# Patient Record
Sex: Female | Born: 1970 | Race: White | Hispanic: No | Marital: Married | Smoking: Never smoker
Health system: Southern US, Community
[De-identification: ages and names within clinical notes are randomized; demographics above are authoritative.]

## PROBLEM LIST (undated history)

## (undated) DIAGNOSIS — F32A Depression, unspecified: Secondary | ICD-10-CM

## (undated) DIAGNOSIS — F329 Major depressive disorder, single episode, unspecified: Secondary | ICD-10-CM

## (undated) DIAGNOSIS — F419 Anxiety disorder, unspecified: Secondary | ICD-10-CM

## (undated) HISTORY — DX: Depression, unspecified: F32.A

## (undated) HISTORY — DX: Anxiety disorder, unspecified: F41.9

## (undated) HISTORY — DX: Major depressive disorder, single episode, unspecified: F32.9

---

## 2002-11-29 ENCOUNTER — Other Ambulatory Visit: Admission: RE | Admit: 2002-11-29 | Discharge: 2002-11-29 | Payer: Self-pay | Admitting: Obstetrics and Gynecology

## 2003-11-12 ENCOUNTER — Inpatient Hospital Stay (HOSPITAL_COMMUNITY): Admission: AD | Admit: 2003-11-12 | Discharge: 2003-11-14 | Payer: Self-pay | Admitting: Pediatrics

## 2003-12-28 ENCOUNTER — Other Ambulatory Visit: Admission: RE | Admit: 2003-12-28 | Discharge: 2003-12-28 | Payer: Self-pay | Admitting: Obstetrics and Gynecology

## 2004-03-10 HISTORY — PX: DILATION AND CURETTAGE OF UTERUS: SHX78

## 2004-11-13 ENCOUNTER — Ambulatory Visit (HOSPITAL_COMMUNITY): Admission: RE | Admit: 2004-11-13 | Discharge: 2004-11-13 | Payer: Self-pay | Admitting: Obstetrics & Gynecology

## 2004-11-13 ENCOUNTER — Encounter (INDEPENDENT_AMBULATORY_CARE_PROVIDER_SITE_OTHER): Payer: Self-pay | Admitting: *Deleted

## 2004-11-29 ENCOUNTER — Ambulatory Visit: Payer: Self-pay | Admitting: Sports Medicine

## 2004-12-20 ENCOUNTER — Ambulatory Visit: Payer: Self-pay | Admitting: Sports Medicine

## 2005-11-14 ENCOUNTER — Inpatient Hospital Stay (HOSPITAL_COMMUNITY): Admission: RE | Admit: 2005-11-14 | Discharge: 2005-11-16 | Payer: Self-pay | Admitting: Obstetrics and Gynecology

## 2005-11-17 ENCOUNTER — Encounter: Admission: RE | Admit: 2005-11-17 | Discharge: 2005-12-16 | Payer: Self-pay | Admitting: Obstetrics and Gynecology

## 2005-12-17 ENCOUNTER — Encounter: Admission: RE | Admit: 2005-12-17 | Discharge: 2006-01-06 | Payer: Self-pay | Admitting: Obstetrics and Gynecology

## 2006-10-05 ENCOUNTER — Ambulatory Visit: Payer: Self-pay | Admitting: Otolaryngology

## 2006-12-25 ENCOUNTER — Encounter: Admission: RE | Admit: 2006-12-25 | Discharge: 2006-12-25 | Payer: Self-pay | Admitting: Obstetrics and Gynecology

## 2007-01-29 ENCOUNTER — Ambulatory Visit: Payer: Self-pay

## 2007-01-29 DIAGNOSIS — M79609 Pain in unspecified limb: Secondary | ICD-10-CM

## 2007-01-29 DIAGNOSIS — M25569 Pain in unspecified knee: Secondary | ICD-10-CM

## 2007-01-29 DIAGNOSIS — M7750 Other enthesopathy of unspecified foot: Secondary | ICD-10-CM | POA: Insufficient documentation

## 2008-05-17 ENCOUNTER — Inpatient Hospital Stay (HOSPITAL_COMMUNITY): Admission: AD | Admit: 2008-05-17 | Discharge: 2008-05-17 | Payer: Self-pay | Admitting: Obstetrics and Gynecology

## 2008-06-19 ENCOUNTER — Inpatient Hospital Stay (HOSPITAL_COMMUNITY): Admission: RE | Admit: 2008-06-19 | Discharge: 2008-06-21 | Payer: Self-pay | Admitting: Obstetrics and Gynecology

## 2008-06-22 ENCOUNTER — Encounter: Admission: RE | Admit: 2008-06-22 | Discharge: 2008-07-21 | Payer: Self-pay | Admitting: Obstetrics and Gynecology

## 2008-07-22 ENCOUNTER — Encounter: Admission: RE | Admit: 2008-07-22 | Discharge: 2008-08-01 | Payer: Self-pay | Admitting: Obstetrics and Gynecology

## 2009-03-10 HISTORY — PX: NASAL SINUS SURGERY: SHX719

## 2009-07-26 ENCOUNTER — Ambulatory Visit: Payer: Self-pay | Admitting: Otolaryngology

## 2009-12-13 ENCOUNTER — Ambulatory Visit: Payer: Self-pay | Admitting: Sports Medicine

## 2010-04-09 NOTE — Assessment & Plan Note (Signed)
Summary: ORTHOTICS,CHECK TOES,MC   Vital Signs:  Patient profile:   40 year old female Height:      70 inches Weight:      140 pounds BMI:     20.16 BP sitting:   124 / 74  History of Present Illness: 40yo female to office for new orthotics.  Last seen about 3-years ago when custom orthotics made for metatarsalgia & atypical plantar fasciitis.  Orthotics were comfortable, but now having increasing foot & toe pain - right > left.  Denies any injury or trauma.  Doing cardio workouts 3-times/wk (elliptical, biking, or running - avg 10 miles per week).  Pain most prominent in her R foot in area of 2nd-4th MTP.  Some assocaited numbness/tingling into her 3rd/4th toes.  Feels MT pads on orthotics no longer supplying support she needs.  Similar pain on left, but not as significant.  Also c/o achiness in her lower legs with activity.  Still wearing old orthotics with MT pads in her shoes.  Allergies (verified): 1)  ! Macrobid 2)  ! Septra 3)  ! Pcn PMH-FH-SH reviewed for relevance  Review of Systems      See HPI  Physical Exam  General:  Well-developed,well-nourished,in no acute distress; alert,appropriate and cooperative throughout examination Msk:  HIPS: full ROM without pain.  Mild weakness of hip adductors & abductors b/l, normal strenght in hip flexors & extensors b/l.  KNEES: normal ROM without pain, tenderness, weakness, laxity bilaterally.  FEET: cavus feet b/l Collapse of transverse arch b/l with splaying between 2nd/3rd toes b/l - left > right. Prominent callous under 2nd & 3rd MT heads Mild TTP along MT heads b/l - L>R No TTP along PF or over heels.  No achilles tenderness. Pulses:  +2/4 DP & PT b/l Neurologic:  sensation intact to light touch.     Impression & Recommendations:  Problem # 1:  FOOT PAIN, BILATERAL (ICD-729.5) -  Related to foot breakdown & metatarsalgia.  Cavus foot also contributes to increased stress throughout the foot.   - Fitted with new custom  orthotics: Patient was fitted for a : standard, cushioned, semi-rigid orthotic. The orthotic was heated and afterward the patient stood on the orthotic blank positioned on the orthotic stand. The patient was positioned in subtalar neutral position and 10 degrees of ankle dorsiflexion in a weight bearing stance. After completion of molding, a stable base was applied to the orthotic blank. The blank was ground to a stable position for weight bearing. Size: 7 - blue swirl Base: blue EVA foam Posting: none Additional orthotic padding: MT pad on both orthotics Orthotics comfortable in office & improved foot pain. - Repaired old set of orthotics with blue EVA foam cushioning.   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  MT pads placed in same position as old pads & felt comfortable in office today.  Orders: [employer], each unit [phone])  Problem # 3:  LEG PAIN (ICD-729.5) - Result of foot breakdown, but also noted to have weakness of hip adductors & abductors on exam today - Educated on HEP with hip adduction/abduction, flexion, extension exercises along with lateral step-ups.  Should perform exercises once daily. - f/u if no improvement with orthotics & exercises.

## 2010-04-29 LAB — HM COLONOSCOPY

## 2010-06-19 LAB — CBC
HCT: 29.7 % — ABNORMAL LOW (ref 36.0–46.0)
HCT: 34.9 % — ABNORMAL LOW (ref 36.0–46.0)
MCHC: 33.9 g/dL (ref 30.0–36.0)
Platelets: 232 10*3/uL (ref 150–400)
RBC: 3.82 MIL/uL — ABNORMAL LOW (ref 3.87–5.11)
RDW: 15 % (ref 11.5–15.5)
RDW: 15.3 % (ref 11.5–15.5)
WBC: 10.8 10*3/uL — ABNORMAL HIGH (ref 4.0–10.5)
WBC: 12.4 10*3/uL — ABNORMAL HIGH (ref 4.0–10.5)

## 2010-06-20 LAB — WET PREP, GENITAL
Clue Cells Wet Prep HPF POC: NONE SEEN
Trich, Wet Prep: NONE SEEN

## 2010-07-23 NOTE — Discharge Summary (Signed)
NAMEQUETZAL, MEANY            ACCOUNT NO.:  [id number]   MEDICAL RECORD NO.:  [id number]          PATIENT TYPE:  INP   LOCATION:  9124                          FACILITY:  [facility]   PHYSICIAN:  Gerrit Friends. Aldona Bar, M.D.   DATE OF BIRTH:  [birth year]   DATE OF ADMISSION:  06/19/2008  DATE OF DISCHARGE:  06/21/2008                               DISCHARGE SUMMARY   DISCHARGE DIAGNOSES:  1. Term pregnancy delivered 6 pounds 15 ounces female infant, Apgars 8      and 9.  2. Blood type O+.   PROCEDURES:  1. Induction of labor.  2. Normal spontaneous delivery.   SUMMARY:  This 40 year old gravida 4, now para 3 was admitted at term  for induction.  Her pregnancy was benign.  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   She was admitted for induction, progressed rapidly, and subsequently had  a normal spontaneous delivery of a viable female infant with good Apgars  over an intact perineum.  Her postpartum course was benign.  Her  discharge hemoglobin was 10.1 with a white count of 12,400 and a  platelet count of 177,000.  On the morning of June 21, 2008, she was  ambulating well, tolerating a regular diet well, having normal bowel and  bladder function, was afebrile, was breast-feeding without difficulty,  and was desirous of discharge.  Accordingly, she was given all  appropriate structures and understood all instructions well.   DISCHARGE MEDICATIONS:  Vitamins - one a day, Feosol capsules - one  daily, and she was given prescriptions for Motrin 600 mg every 6 hours  to use as needed for cramping or back pain and Tylox 1-2 every 4-6 hours  as needed for more severe pain.  She will return to the office for  followup in approximately 4 weeks' time or as needed.   CONDITION ON DISCHARGE:  Improved.      Gerrit Friends. Aldona Bar, M.D.  Electronically Signed     RMW/MEDQ  D:  06/21/2008  T:  06/21/2008  Job:  [id number]

## 2010-07-26 NOTE — Op Note (Signed)
Brittany Savage, Brittany Savage NO.:  [id number]   MEDICAL RECORD NO.:  [id number]          PATIENT TYPE:  AMB   LOCATION:  SDC                           FACILITY:  [facility]   PHYSICIAN:  Ilda Mori, M.D.   DATE OF BIRTH:  19-May-1970   DATE OF PROCEDURE:  11/13/2004  DATE OF DISCHARGE:                                 OPERATIVE REPORT   PREOPERATIVE DIAGNOSIS:  Fetal anomaly, cystic hygroma, 11-week gestation.   POSTOPERATIVE DIAGNOSIS:  Fetal anomaly, cystic hygroma, 11-week gestation.   PROCEDURE:  Dilatation, evacuation.   SURGEON:  Dr. Ilda Mori.   ANESTHESIA:  Was paracervical block with IV sedation.   ESTIMATED BLOOD LOSS:  20 mL.   FINDINGS:  Products of conception consistent with an 11-12 week pregnancy.   SPECIMENS:  POC to pathology and a fetal part was isolated and sent in a  special medium for chromosomal analysis.   COMPLICATIONS:  Were none.   INDICATIONS:  This is a 40 year old gravida 2, para 1 who was evaluated for  increased risk for chromosomal abnormalities with first trimester screening  technique. During the ultrasound screening for nuchal thickness, a cystic  hygroma was identified. The possible significance of this finding were  discussed with the patient and her husband and they elected to terminate the  pregnancy.   PROCEDURE:  The patient was taken to the operating room and placed in supine  position with IV sedation was administered and placed in dorsal supine  position. The vulva and vagina were prepped and draped in a sterile fashion.  20 mL of 1% plain lidocaine was placed in the paracervical tissues. The  internal os was dilated with Pratt dilators to a 37-French.  A #12 suction  curet was introduced into the endometrial cavity and the products of  conception were evacuated. The specimen was then evaluated and appeared to  be complete and sent for pathology and a portion for chromosomal analysis.  At this point the procedure  was terminated. The patient left the operating  room in good condition.      Ilda Mori, M.D.  Electronically Signed    RK/MEDQ  D:  11/13/2004  T:  11/13/2004  Job:  [id number]

## 2010-11-13 ENCOUNTER — Other Ambulatory Visit: Payer: Self-pay | Admitting: Obstetrics and Gynecology

## 2013-08-15 ENCOUNTER — Other Ambulatory Visit: Payer: Self-pay | Admitting: Obstetrics and Gynecology

## 2013-08-16 ENCOUNTER — Telehealth: Payer: Self-pay | Admitting: Genetic Counselor

## 2013-08-16 LAB — CYTOLOGY - PAP

## 2013-08-16 NOTE — Telephone Encounter (Signed)
CALLED PATIENT TO SCHEDULE GENETIC APPT PER PATIENT CALL BACK ON TOMORROW WAS IN MEETING.

## 2013-08-23 ENCOUNTER — Telehealth: Payer: Self-pay | Admitting: Genetic Counselor

## 2013-08-23 NOTE — Telephone Encounter (Signed)
LEFT MESSAGE FOR PATIENT TO RETURN CALL TO SCHEDULE GENETIC APPT.  °

## 2013-12-21 ENCOUNTER — Other Ambulatory Visit: Payer: Self-pay | Admitting: Obstetrics and Gynecology

## 2013-12-21 DIAGNOSIS — R928 Other abnormal and inconclusive findings on diagnostic imaging of breast: Secondary | ICD-10-CM

## 2014-01-02 ENCOUNTER — Other Ambulatory Visit: Payer: Self-pay

## 2014-01-03 ENCOUNTER — Ambulatory Visit
Admission: RE | Admit: 2014-01-03 | Discharge: 2014-01-03 | Disposition: A | Discharge status: Home / Self Care | Payer: PRIVATE HEALTH INSURANCE | Source: Ambulatory Visit | Attending: Obstetrics and Gynecology | Admitting: Obstetrics and Gynecology

## 2014-01-03 DIAGNOSIS — R928 Other abnormal and inconclusive findings on diagnostic imaging of breast: Secondary | ICD-10-CM

## 2014-05-03 ENCOUNTER — Ambulatory Visit: Payer: Self-pay | Admitting: Family Medicine

## 2014-05-11 DIAGNOSIS — R002 Palpitations: Secondary | ICD-10-CM | POA: Insufficient documentation

## 2014-05-11 DIAGNOSIS — R0789 Other chest pain: Secondary | ICD-10-CM | POA: Insufficient documentation

## 2014-08-18 ENCOUNTER — Other Ambulatory Visit: Payer: Self-pay | Admitting: Family Medicine

## 2014-08-18 DIAGNOSIS — F4322 Adjustment disorder with anxiety: Secondary | ICD-10-CM

## 2014-08-18 DIAGNOSIS — F4320 Adjustment disorder, unspecified: Secondary | ICD-10-CM | POA: Insufficient documentation

## 2014-09-12 ENCOUNTER — Other Ambulatory Visit: Payer: Self-pay | Admitting: Obstetrics and Gynecology

## 2014-09-13 LAB — CYTOLOGY - PAP

## 2014-10-07 ENCOUNTER — Other Ambulatory Visit: Payer: Self-pay | Admitting: Family Medicine

## 2014-10-07 DIAGNOSIS — J309 Allergic rhinitis, unspecified: Secondary | ICD-10-CM

## 2014-10-09 ENCOUNTER — Other Ambulatory Visit: Payer: Self-pay | Admitting: Family Medicine

## 2014-10-09 DIAGNOSIS — J309 Allergic rhinitis, unspecified: Secondary | ICD-10-CM | POA: Insufficient documentation

## 2014-10-09 NOTE — Telephone Encounter (Signed)
Last ov 04/2014  Thanks,   -Mickel Baas

## 2015-06-26 ENCOUNTER — Other Ambulatory Visit: Payer: Self-pay

## 2015-06-26 DIAGNOSIS — Z1231 Encounter for screening mammogram for malignant neoplasm of breast: Secondary | ICD-10-CM

## 2015-07-13 ENCOUNTER — Ambulatory Visit
Admission: RE | Admit: 2015-07-13 | Discharge: 2015-07-13 | Disposition: A | Discharge status: Home / Self Care | Payer: PRIVATE HEALTH INSURANCE | Source: Ambulatory Visit

## 2015-07-13 DIAGNOSIS — Z1231 Encounter for screening mammogram for malignant neoplasm of breast: Secondary | ICD-10-CM

## 2015-07-17 ENCOUNTER — Ambulatory Visit: Payer: PRIVATE HEALTH INSURANCE

## 2015-08-07 ENCOUNTER — Ambulatory Visit: Payer: PRIVATE HEALTH INSURANCE

## 2015-08-16 ENCOUNTER — Other Ambulatory Visit: Payer: Self-pay | Admitting: Family Medicine

## 2015-08-16 DIAGNOSIS — F4322 Adjustment disorder with anxiety: Secondary | ICD-10-CM

## 2015-10-08 ENCOUNTER — Other Ambulatory Visit: Payer: Self-pay | Admitting: Family Medicine

## 2015-10-08 DIAGNOSIS — J309 Allergic rhinitis, unspecified: Secondary | ICD-10-CM

## 2015-11-08 ENCOUNTER — Other Ambulatory Visit: Payer: Self-pay | Admitting: Family Medicine

## 2015-11-08 DIAGNOSIS — F4322 Adjustment disorder with anxiety: Secondary | ICD-10-CM

## 2015-12-10 ENCOUNTER — Other Ambulatory Visit: Payer: Self-pay | Admitting: Obstetrics and Gynecology

## 2015-12-11 LAB — CYTOLOGY - PAP

## 2016-04-02 ENCOUNTER — Ambulatory Visit (INDEPENDENT_AMBULATORY_CARE_PROVIDER_SITE_OTHER): Payer: PRIVATE HEALTH INSURANCE | Admitting: Physician Assistant

## 2016-04-02 ENCOUNTER — Encounter: Payer: Self-pay | Admitting: Physician Assistant

## 2016-04-02 VITALS — BP 118/62 | HR 60 | Temp 98.6°F | Resp 16 | Wt 164.0 lb

## 2016-04-02 DIAGNOSIS — R6889 Other general symptoms and signs: Secondary | ICD-10-CM

## 2016-04-02 LAB — POCT INFLUENZA A/B
Influenza A, POC: NEGATIVE
Influenza B, POC: NEGATIVE

## 2016-04-02 MED ORDER — OSELTAMIVIR PHOSPHATE 75 MG PO CAPS
75.0000 mg | ORAL_CAPSULE | Freq: Two times a day (BID) | ORAL | 0 refills | Status: AC
Start: 2016-04-02 — End: 2016-04-07

## 2016-04-02 NOTE — Patient Instructions (Signed)

## 2016-04-02 NOTE — Progress Notes (Signed)
[place]  Chief Complaint  Patient presents with  . URI    Started Monday    Subjective:    Patient ID: Brittany Savage, female    DOB: 19-Jun-1970, 46 y.o.   MRN: [id number]  Upper Respiratory Infection: Brittany Savage is a 47 y.o. female with exposure to her flu positive children complaining of symptoms of a URI. Symptoms include congestion, cough and sore throat. Onset of symptoms was 2 days ago, gradually worsening since that time. She also c/o achiness, congestion, cough described as productive and nasal congestion for the past 2 days .  She is drinking plenty of fluids. Evaluation to date: none. Treatment to date: cough suppressants. The treatment has provided no relief.   Review of Systems  Constitutional: Positive for diaphoresis and fatigue. Negative for activity change, appetite change, chills, fever and unexpected weight change.  HENT: Positive for congestion and sore throat. Negative for ear discharge, ear pain, nosebleeds, postnasal drip, rhinorrhea, sinus pain, sinus pressure and sneezing.   Eyes: Positive for itching. Negative for photophobia, pain, discharge, redness and visual disturbance.  Respiratory: Positive for cough, chest tightness and shortness of breath. Negative for apnea, choking, wheezing and stridor.   Gastrointestinal: Positive for nausea. Negative for abdominal distention, abdominal pain, anal bleeding, blood in stool, constipation, diarrhea, rectal pain and vomiting.  Musculoskeletal: Positive for arthralgias and myalgias.  Neurological: Positive for headaches. Negative for dizziness and light-headedness.       Objective:   BP 118/62 (BP Location: Left Arm, Patient Position: Sitting, Cuff Size: Normal)   Pulse 60   Temp 98.6 F (37 C) (Oral)   Resp 16   Wt 164 lb (74.4 kg)   LMP 03/17/2016   BMI 23.53 kg/m   Patient Active Problem List   Diagnosis Date Noted  . Allergic rhinitis 10/09/2014  .  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 KNEE PAIN, LEFT 01/29/2007  . METATARSALGIA 01/29/2007  . Pain in limb 01/29/2007    Outpatient Encounter Prescriptions as of 04/02/2016  Medication Sig  . fluticasone (FLONASE) 50 MCG/ACT nasal spray USE TWO PUFFS INTO EACH NOSTRIL DAILY  . sertraline (ZOLOFT) 100 MG tablet TAKE 1 TABLET EVERY DAY  . oseltamivir (TAMIFLU) 75 MG capsule Take 1 capsule (75 mg total) by mouth 2 (two) times daily.   No facility-administered encounter medications on file as of 04/02/2016.     Allergies  Allergen Reactions  . Nitrofurantoin   . Penicillins   . Sulfamethoxazole-Trimethoprim        Physical Exam  Constitutional: She appears well-developed and well-nourished. She appears ill.  HENT:  Right Ear: External ear normal.  Left Ear: External ear normal.  Mouth/Throat: Oropharynx is clear and moist. No oropharyngeal exudate.  Eyes: Right eye exhibits discharge. Left eye exhibits discharge.  Neck: Neck supple.  Cardiovascular: Normal rate and regular rhythm.   Pulmonary/Chest: Effort normal. No respiratory distress. She has wheezes. She has no rales.  Mild right sided expiratory wheeze.  Lymphadenopathy:    She has no cervical adenopathy.  Skin: Skin is warm and dry.  Psychiatric: She has a normal mood and affect. Her behavior is normal.       Assessment & Plan:  1. Flu-like symptoms  Will treat as below despite rapid flu swab being negative 2/2 exposure to flu positive kids and her having had flu vaccine. Patient is prone to sinus infections and she may call back in 7 days if this turns into one.  -  oseltamivir (TAMIFLU) 75 MG capsule; Take 1 capsule (75 mg total) by mouth 2 (two) times daily.  Dispense: 10 capsule; Refill: 0   Recommend rest, fluids, frequent hand washing. Work note provided  Return if symptoms worsen or fail to improve.   Patient Instructions  Influenza, Adult Influenza ("the flu") is an infection in the lungs, nose, and  throat (respiratory tract). It is caused by a virus. The flu causes many common cold symptoms, as well as a high fever and body aches. It can make you feel very sick. The flu spreads easily from person to person (is contagious). Getting a flu shot (influenza vaccination) every year is the best way to prevent the flu. Follow these instructions at home:  Take over-the-counter and prescription medicines only as told by your doctor.  Use a cool mist humidifier to add moisture (humidity) to the air in your home. This can make it easier to breathe.  Rest as needed.  Drink enough fluid to keep your pee (urine) clear or pale yellow.  Cover your mouth and nose when you cough or sneeze.  Wash your hands with soap and water often, especially after you cough or sneeze. If you cannot use soap and water, use hand sanitizer.  Stay home from work or school as told by your doctor. Unless you are visiting your doctor, try to avoid leaving home until your fever has been gone for 24 hours without the use of medicine.  Keep all follow-up visits as told by your doctor. This is important. How is this prevented?  Getting a yearly (annual) flu shot is the best way to avoid getting the flu. You may get the flu shot in late summer, fall, or winter. Ask your doctor when you should get your flu shot.  Wash your hands often or use hand sanitizer often.  Avoid contact with people who are sick during cold and flu season.  Eat healthy foods.  Drink plenty of fluids.  Get enough sleep.  Exercise regularly. Contact a doctor if:  You get new symptoms.  You have:  Chest pain.  Watery poop (diarrhea).  A fever.  Your cough gets worse.  You start to have more mucus.  You feel sick to your stomach (nauseous).  You throw up (vomit). Get help right away if:  You start to be short of breath or have trouble breathing.  Your skin or nails turn a bluish color.  You have very bad pain or stiffness in  your neck.  You get a sudden headache.  You get sudden pain in your face or ear.  You cannot stop throwing up. This information is not intended to replace advice given to you by your health care provider. Make sure you discuss any questions you have with your health care provider. Document Released: 12/04/2007 Document Revised: 08/02/2015 Document Reviewed: 12/19/2014 Elsevier Interactive Patient Education  2017 [employer].     The entirety of the information documented in the History of Present Illness, Review of Systems and Physical Exam were personally obtained by me. Portions of this information were initially documented by Ashley Royalty, CMA and reviewed by me for thoroughness and accuracy.

## 2016-04-03 ENCOUNTER — Telehealth: Payer: Self-pay

## 2016-04-03 NOTE — Telephone Encounter (Signed)
Patient requesting a prescription for albuterol inhaler. Patient reports she started wheezing this afternoon denies shortness of breath. Please review. Thank you. sd

## 2016-04-04 ENCOUNTER — Other Ambulatory Visit: Payer: Self-pay | Admitting: Physician Assistant

## 2016-04-04 DIAGNOSIS — R062 Wheezing: Secondary | ICD-10-CM

## 2016-04-04 MED ORDER — ALBUTEROL SULFATE HFA 108 (90 BASE) MCG/ACT IN AERS: 2.0000 | INHALATION_SPRAY | Freq: Four times a day (QID) | RESPIRATORY_TRACT | 2 refills | Status: DC | PRN

## 2016-04-04 NOTE — Progress Notes (Signed)
Sent in albuterol inhaler to [facility]. 2 puffs every six hours as needed.

## 2016-05-22 ENCOUNTER — Encounter: Payer: Self-pay | Admitting: Physician Assistant

## 2016-05-22 ENCOUNTER — Ambulatory Visit (INDEPENDENT_AMBULATORY_CARE_PROVIDER_SITE_OTHER): Payer: BLUE CROSS/BLUE SHIELD | Admitting: Physician Assistant

## 2016-05-22 VITALS — BP 122/84 | HR 72 | Temp 98.8°F | Resp 16 | Ht 70.0 in | Wt 161.0 lb

## 2016-05-22 DIAGNOSIS — R1031 Right lower quadrant pain: Secondary | ICD-10-CM | POA: Diagnosis not present

## 2016-05-22 DIAGNOSIS — Z0000 Encounter for general adult medical examination without abnormal findings: Secondary | ICD-10-CM

## 2016-05-22 DIAGNOSIS — R61 Generalized hyperhidrosis: Secondary | ICD-10-CM

## 2016-05-22 NOTE — Patient Instructions (Signed)
Health Maintenance, Female Adopting a healthy lifestyle and getting preventive care can go a long way to promote health and wellness. Talk with your health care provider about what schedule of regular examinations is right for you. This is a good chance for you to check in with your provider about disease prevention and staying healthy. In between checkups, there are plenty of things you can do on your own. Experts have done a lot of research about which lifestyle changes and preventive measures are most likely to keep you healthy. Ask your health care provider for more information. Weight and diet Eat a healthy diet  Be sure to include plenty of vegetables, fruits, low-fat dairy products, and lean protein.  Do not eat a lot of foods high in solid fats, added sugars, or salt.  Get regular exercise. This is one of the most important things you can do for your health.  Most adults should exercise for at least 150 minutes each week. The exercise should increase your heart rate and make you sweat (moderate-intensity exercise).  Most adults should also do strengthening exercises at least twice a week. This is in addition to the moderate-intensity exercise. Maintain a healthy weight  Body mass index (BMI) is a measurement that can be used to identify possible weight problems. It estimates body fat based on height and weight. Your health care provider can help determine your BMI and help you achieve or maintain a healthy weight.  For females 48 years of age and older:  A BMI below 18.5 is considered underweight.  A BMI of 18.5 to 24.9 is normal.  A BMI of 25 to 29.9 is considered overweight.  A BMI of 30 and above is considered obese. Watch levels of cholesterol and blood lipids  You should start having your blood tested for lipids and cholesterol at 48 years of age, then have this test every 5 years.  You may need to have your cholesterol levels checked more often if:  Your lipid or  cholesterol levels are high.  You are older than 48 years of age.  You are at high risk for heart disease. Cancer screening Lung Cancer  Lung cancer screening is recommended for adults 64-48 years old who are at high risk for lung cancer because of a history of smoking.  A yearly low-dose CT scan of the lungs is recommended for people who:  Currently smoke.  Have quit within the past 15 years.  Have at least a 30-pack-year history of smoking. A pack year is smoking an average of one pack of cigarettes a day for 1 year.  Yearly screening should continue until it has been 15 years since you quit.  Yearly screening should stop if you develop a health problem that would prevent you from having lung cancer treatment. Breast Cancer  Practice breast self-awareness. This means understanding how your breasts normally appear and feel.  It also means doing regular breast self-exams. Let your health care provider know about any changes, no matter how small.  If you are in your 48s or 30s, you should have a clinical breast exam (CBE) by a health care provider every 1-3 years as part of a regular health exam.  If you are 48 or older, have a CBE every year. Also consider having a breast X-ray (mammogram) every year.  If you have a family history of breast cancer, talk to your health care provider about genetic screening.  If you are at high risk for breast cancer, talk  to your health care provider about having an MRI and a mammogram every year.  Breast cancer gene (BRCA) assessment is recommended for women who have family members with BRCA-related cancers. BRCA-related cancers include:  Breast.  Ovarian.  Tubal.  Peritoneal cancers.  Results of the assessment will determine the need for genetic counseling and BRCA1 and BRCA2 testing. Cervical Cancer  Your health care provider may recommend that you be screened regularly for cancer of the pelvic organs (ovaries, uterus, and vagina).  This screening involves a pelvic examination, including checking for microscopic changes to the surface of your cervix (Pap test). You may be encouraged to have this screening done every 3 years, beginning at age 48.  For women ages 66-65, health care providers may recommend pelvic exams and Pap testing every 3 years, or they may recommend the Pap and pelvic exam, combined with testing for human papilloma virus (HPV), every 5 years. Some types of HPV increase your risk of cervical cancer. Testing for HPV may also be done on women of any age with unclear Pap test results.  Other health care providers may not recommend any screening for nonpregnant women who are considered low risk for pelvic cancer and who do not have symptoms. Ask your health care provider if a screening pelvic exam is right for you.  If you have had past treatment for cervical cancer or a condition that could lead to cancer, you need Pap tests and screening for cancer for at least 20 years after your treatment. If Pap tests have been discontinued, your risk factors (such as having a new sexual partner) need to be reassessed to determine if screening should resume. Some women have medical problems that increase the chance of getting cervical cancer. In these cases, your health care provider may recommend more frequent screening and Pap tests. Colorectal Cancer  This type of cancer can be detected and often prevented.  Routine colorectal cancer screening usually begins at 48 years of age and continues through 48 years of age.  Your health care provider may recommend screening at an earlier age if you have risk factors for colon cancer.  Your health care provider may also recommend using home test kits to check for hidden blood in the stool.  A small camera at the end of a tube can be used to examine your colon directly (sigmoidoscopy or colonoscopy). This is done to check for the earliest forms of colorectal cancer.  Routine  screening usually begins at age 48.  Direct examination of the colon should be repeated every 5-10 years through 48 years of age. However, you may need to be screened more often if early forms of precancerous polyps or small growths are found. Skin Cancer  Check your skin from head to toe regularly.  Tell your health care provider about any new moles or changes in moles, especially if there is a change in a mole's shape or color.  Also tell your health care provider if you have a mole that is larger than the size of a pencil eraser.  Always use sunscreen. Apply sunscreen liberally and repeatedly throughout the day.  Protect yourself by wearing long sleeves, pants, a wide-brimmed hat, and sunglasses whenever you are outside. Heart disease, diabetes, and high blood pressure  High blood pressure causes heart disease and increases the risk of stroke. High blood pressure is more likely to develop in:  People who have blood pressure in the high end of the normal range (130-139/85-89 mm Hg).  People who are overweight or obese.  People who are African American.  If you are 59-24 years of age, have your blood pressure checked every 3-5 years. If you are 34 years of age or older, have your blood pressure checked every year. You should have your blood pressure measured twice-once when you are at a hospital or clinic, and once when you are not at a hospital or clinic. Record the average of the two measurements. To check your blood pressure when you are not at a hospital or clinic, you can use:  An automated blood pressure machine at a pharmacy.  A home blood pressure monitor.  If you are between 29 years and 60 years old, ask your health care provider if you should take aspirin to prevent strokes.  Have regular diabetes screenings. This involves taking a blood sample to check your fasting blood sugar level.  If you are at a normal weight and have a low risk for diabetes, have this test once  every three years after 48 years of age.  If you are overweight and have a high risk for diabetes, consider being tested at a younger age or more often. Preventing infection Hepatitis B  If you have a higher risk for hepatitis B, you should be screened for this virus. You are considered at high risk for hepatitis B if:  You were born in a country where hepatitis B is common. Ask your health care provider which countries are considered high risk.  Your parents were born in a high-risk country, and you have not been immunized against hepatitis B (hepatitis B vaccine).  You have HIV or AIDS.  You use needles to inject street drugs.  You live with someone who has hepatitis B.  You have had sex with someone who has hepatitis B.  You get hemodialysis treatment.  You take certain medicines for conditions, including cancer, organ transplantation, and autoimmune conditions. Hepatitis C  Blood testing is recommended for:  Everyone born from [birth year] through 1967.  Anyone with known risk factors for hepatitis C. Sexually transmitted infections (STIs)  You should be screened for sexually transmitted infections (STIs) including gonorrhea and chlamydia if:  You are sexually active and are younger than 48 years of age.  You are older than 48 years of age and your health care provider tells you that you are at risk for this type of infection.  Your sexual activity has changed since you were last screened and you are at an increased risk for chlamydia or gonorrhea. Ask your health care provider if you are at risk.  If you do not have HIV, but are at risk, it may be recommended that you take a prescription medicine daily to prevent HIV infection. This is called pre-exposure prophylaxis (PrEP). You are considered at risk if:  You are sexually active and do not regularly use condoms or know the HIV status of your partner(s).  You take drugs by injection.  You are sexually active with a partner  who has HIV. Talk with your health care provider about whether you are at high risk of being infected with HIV. If you choose to begin PrEP, you should first be tested for HIV. You should then be tested every 3 months for as long as you are taking PrEP. Pregnancy  If you are premenopausal and you may become pregnant, ask your health care provider about preconception counseling.  If you may become pregnant, take 400 to 800 micrograms (mcg) of folic acid  every day.  If you want to prevent pregnancy, talk to your health care provider about birth control (contraception). Osteoporosis and menopause  Osteoporosis is a disease in which the bones lose minerals and strength with aging. This can result in serious bone fractures. Your risk for osteoporosis can be identified using a bone density scan.  If you are 4 years of age or older, or if you are at risk for osteoporosis and fractures, ask your health care provider if you should be screened.  Ask your health care provider whether you should take a calcium or vitamin D supplement to lower your risk for osteoporosis.  Menopause may have certain physical symptoms and risks.  Hormone replacement therapy may reduce some of these symptoms and risks. Talk to your health care provider about whether hormone replacement therapy is right for you. Follow these instructions at home:  Schedule regular health, dental, and eye exams.  Stay current with your immunizations.  Do not use any tobacco products including cigarettes, chewing tobacco, or electronic cigarettes.  If you are pregnant, do not drink alcohol.  If you are breastfeeding, limit how much and how often you drink alcohol.  Limit alcohol intake to no more than 1 drink per day for nonpregnant women. One drink equals 12 ounces of beer, 5 ounces of wine, or 1 ounces of hard liquor.  Do not use street drugs.  Do not share needles.  Ask your health care provider for help if you need support  or information about quitting drugs.  Tell your health care provider if you often feel depressed.  Tell your health care provider if you have ever been abused or do not feel safe at home. This information is not intended to replace advice given to you by your health care provider. Make sure you discuss any questions you have with your health care provider. Document Released: 09/09/2010 Document Revised: 08/02/2015 Document Reviewed: 11/28/2014 Elsevier Interactive Patient Education  2017 [employer].

## 2016-05-22 NOTE — Progress Notes (Signed)
Patient: Brittany Savage, Female    DOB: 1970-07-02, 46 y.o.   MRN: [id number] Visit Date: 05/22/2016  Today's Provider: Trinna Post, PA-C   Chief Complaint  Patient presents with  . Annual Exam   Subjective:    Annual physical exam Brittany Savage is a 46 y.o. female who presents today for health maintenance and complete physical.  She feels fairly well.  Pt reports both her ears feel full. Also says she has a "Dull Ache" Lower right abdomen.  She reports exercising regularly. She reports she is sleeping fairly well.  She is having trouble with night sweats, but says she will drink three glasses of wine at night and it helps with the symptoms.   She lives in [place] with her husband and three children who are ages 57, 49, and 42. 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 She lives in [place].  Some ear fullness with traveling, uses flonase and then Sudafed before flights.  She smoked briefly and infrequently in high school. She drinks three 5oz glasses of wine per night. Reports it helps with night sweats. She has gone a month without drinking at one point when trying to do an alcohol free period, but has since resumed. Not using drugs. Has looked up a drug which reduces cravings.  She sees Dr. Freda Munro at [facility] in [place] for gynecologic care. She has a history of HPV positive and abnormal cervical cells at age 21-20, had to get laser ablation. She gets PAPs every year. They have been normal. She also gets her mammograms through OBGYN, no history of biopsy or malignancy. Breast cancer in mother at age 20.  Has been having intermittent RLQ aching. Happens about 1-2 per week. 2/10 on pain scale, just dull aching. She thinks it's MSK as she does a lot of working out. No bowel/bladder issues, no abnormal vaginal bleeding.   She is currently having periods. Last one was heavier than normal.  Sexually active, no protection, husband had vasectomy.  Taking zoloft 100 mg for depression, doing well. This was last refilled by OBGYN. No SI/HI.  History of colon cancer in grandmother in her late 65's. -----------------------------------------------------------------   Review of Systems  Constitutional: Negative.   HENT: Negative.   Eyes: Negative.   Respiratory: Negative.   Cardiovascular: Negative.   Gastrointestinal: Negative.   Endocrine: Negative.   Genitourinary: Negative.   Musculoskeletal: Negative.   Skin: Negative.   Allergic/Immunologic: Negative.   Neurological: Negative.   Hematological: Negative.   Psychiatric/Behavioral: Negative.     Social History      She  reports that she has never smoked. She has never used smokeless tobacco. She reports that she drinks about 12.6 oz of alcohol per week . She reports that she does not use drugs.   Depression screen PHQ 2/9 05/22/2016  Decreased Interest 0  Down, Depressed, Hopeless 0  PHQ - 2 Score 0          Social History   Social History  . Marital status: Married    Spouse name: N/A  . Number of children: N/A  . Years of education: N/A   Social History Main Topics  . Smoking status: Never Smoker  . Smokeless tobacco: Never Used  . Alcohol use 12.6 oz/week    21 Glasses of wine per week  . Drug use: No  . Sexual activity: Not  Asked   Other Topics Concern  . None   Social History Narrative  . None    No past medical history on file.   Patient Active Problem List   Diagnosis Date Noted  . Allergic rhinitis 10/09/2014  . 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 KNEE PAIN, LEFT 01/29/2007  . METATARSALGIA 01/29/2007  . Pain in limb 01/29/2007    Past Surgical History:  Procedure Laterality Date  . DILATION AND CURETTAGE OF UTERUS  2006  . NASAL SINUS SURGERY  2011    Family History        Family Status  Relation Status  . Mother Alive  . Father Alive        Her family history includes  Breast cancer in her mother; Heart disease in her father.     Allergies  Allergen Reactions  . Nitrofurantoin   . Penicillins   . Sulfamethoxazole-Trimethoprim        Current Outpatient Prescriptions:  .  albuterol (PROVENTIL HFA;VENTOLIN HFA) 108 (90 Base) MCG/ACT inhaler, Inhale 2 puffs into the lungs every 6 (six) hours as needed for wheezing or shortness of breath., Disp: 1 Inhaler, Rfl: 2 .  fluticasone (FLONASE) 50 MCG/ACT nasal spray, USE TWO PUFFS INTO EACH NOSTRIL DAILY, Disp: 16 g, Rfl: 5 .  sertraline (ZOLOFT) 100 MG tablet, TAKE 1 TABLET EVERY DAY, Disp: 90 tablet, Rfl: 0   Patient Care Team: Trinna Post, PA-C as PCP - General (Physician Assistant)      Objective:   Vitals: BP 122/84 (BP Location: Left Arm, Patient Position: Sitting, Cuff Size: Normal)   Pulse 72   Temp 98.8 F (37.1 C) (Oral)   Resp 16   Ht 5\' 10"  (1.778 m)   Wt 161 lb (73 kg)   LMP 05/11/2016   BMI 23.10 kg/m    Vitals:   05/22/16 0923  BP: 122/84  Pulse: 72  Resp: 16  Temp: 98.8 F (37.1 C)  TempSrc: Oral  Weight: 161 lb (73 kg)  Height: 5\' 10"  (1.778 m)     Physical Exam  Constitutional: She is oriented to person, place, and time. She appears well-developed and well-nourished. No distress.  HENT:  Right Ear: Tympanic membrane and external ear normal.  Left Ear: Tympanic membrane and external ear normal.  Mouth/Throat: Oropharynx is clear and moist. No oropharyngeal exudate.  Eyes: Conjunctivae are normal. Pupils are equal, round, and reactive to light.  Neck: Neck supple. No thyromegaly present.  Cardiovascular: Normal rate, regular rhythm and normal heart sounds.   Pulmonary/Chest: Effort normal and breath sounds normal.  Abdominal: Soft. Bowel sounds are normal. She exhibits no distension and no mass. There is no tenderness. There is no rebound and no guarding.  Lymphadenopathy:    She has no cervical adenopathy.  Neurological: She is alert and oriented to person,  place, and time.  Skin: Skin is warm and dry.  Psychiatric: She has a normal mood and affect. Her behavior is normal.     Depression Screen PHQ 2/9 Scores 05/22/2016  PHQ - 2 Score 0      Assessment & Plan:     Routine Health Maintenance and Physical Exam  Exercise Activities and Dietary recommendations Goals    None       There is no immunization history on file for this patient.  Health Maintenance  Topic Date Due  . HIV Screening  01/23/1984  . TETANUS/TDAP  01/23/1988  . INFLUENZA VACCINE  10/09/2015  . PAP SMEAR  12/10/2018     Discussed health benefits of physical activity, and encouraged her to engage in regular exercise appropriate for her age and condition.    1. Annual physical exam  Breast and pelvic deferred to GYN. Will have patient sign ROI so we can have PAP results. Labs as below.  Discussed alcohol use with patient and recommended daily limit. Patient has also had colonoscopy in 2012 that was normal.   - Comprehensive metabolic panel - CBC with Differential/Platelet - Lipid panel  2. Night sweats  - TSH  3. RLQ abdominal pain  Seems MSK. No alarm features. Pt will call back if worsening.  The entirety of the information documented in the History of Present Illness, Review of Systems and Physical Exam were personally obtained by me. Portions of this information were initially documented by Ashley Royalty, CMA and reviewed by me for thoroughness and accuracy.   Return in about 1 year (around 05/22/2017) for CPE .   --------------------------------------------------------------------    Trinna Post, PA-C  [facility]

## 2016-05-23 LAB — COMPREHENSIVE METABOLIC PANEL
ALT: 16 IU/L (ref 0–32)
AST: 24 IU/L (ref 0–40)
Albumin/Globulin Ratio: 1.9 (ref 1.2–2.2)
Albumin: 4.6 g/dL (ref 3.5–5.5)
Alkaline Phosphatase: 52 IU/L (ref 39–117)
BUN/Creatinine Ratio: 16 (ref 9–23)
BUN: 12 mg/dL (ref 6–24)
Bilirubin Total: 0.4 mg/dL (ref 0.0–1.2)
CO2: 25 mmol/L (ref 18–29)
Calcium: 9.1 mg/dL (ref 8.7–10.2)
Chloride: 98 mmol/L (ref 96–106)
Creatinine, Ser: 0.75 mg/dL (ref 0.57–1.00)
GFR calc Af Amer: 110 mL/min/{1.73_m2} (ref >59)
GFR calc non Af Amer: 95 mL/min/{1.73_m2} (ref >59)
Globulin, Total: 2.4 g/dL (ref 1.5–4.5)
Glucose: 84 mg/dL (ref 65–99)
Potassium: 5 mmol/L (ref 3.5–5.2)
Sodium: 137 mmol/L (ref 134–144)
Total Protein: 7 g/dL (ref 6.0–8.5)

## 2016-05-23 LAB — CBC WITH DIFFERENTIAL/PLATELET
Basophils Absolute: 0.1 10*3/uL (ref 0.0–0.2)
Basos: 1 %
EOS (ABSOLUTE): 0.2 10*3/uL (ref 0.0–0.4)
Eos: 2 %
Hematocrit: 38.7 % (ref 34.0–46.6)
Hemoglobin: 12.6 g/dL (ref 11.1–15.9)
Immature Grans (Abs): 0 10*3/uL (ref 0.0–0.1)
Immature Granulocytes: 0 %
Lymphocytes Absolute: 2.2 10*3/uL (ref 0.7–3.1)
Lymphs: 27 %
MCH: 31.3 pg (ref 26.6–33.0)
MCHC: 32.6 g/dL (ref 31.5–35.7)
MCV: 96 fL (ref 79–97)
Monocytes Absolute: 0.5 10*3/uL (ref 0.1–0.9)
Monocytes: 6 %
Neutrophils Absolute: 5.1 10*3/uL (ref 1.4–7.0)
Neutrophils: 64 %
Platelets: 310 10*3/uL (ref 150–379)
RBC: 4.03 x10E6/uL (ref 3.77–5.28)
RDW: 13.5 % (ref 12.3–15.4)
WBC: 8 10*3/uL (ref 3.4–10.8)

## 2016-05-23 LAB — LIPID PANEL
Chol/HDL Ratio: 2.6 ratio units (ref 0.0–4.4)
Cholesterol, Total: 187 mg/dL (ref 100–199)
HDL: 72 mg/dL (ref >39)
LDL Calculated: 93 mg/dL (ref 0–99)
Triglycerides: 109 mg/dL (ref 0–149)
VLDL Cholesterol Cal: 22 mg/dL (ref 5–40)

## 2016-05-23 LAB — TSH: TSH: 1.62 u[IU]/mL (ref 0.450–4.500)

## 2016-05-30 ENCOUNTER — Encounter: Payer: Self-pay | Admitting: Physician Assistant

## 2016-06-25 ENCOUNTER — Other Ambulatory Visit: Payer: Self-pay | Admitting: Obstetrics and Gynecology

## 2016-08-21 ENCOUNTER — Other Ambulatory Visit: Payer: Self-pay | Admitting: Obstetrics and Gynecology

## 2017-03-19 DIAGNOSIS — M7541 Impingement syndrome of right shoulder: Secondary | ICD-10-CM | POA: Diagnosis not present

## 2017-04-30 ENCOUNTER — Encounter: Payer: Self-pay | Admitting: Family Medicine

## 2017-04-30 ENCOUNTER — Ambulatory Visit: Payer: BLUE CROSS/BLUE SHIELD | Admitting: Family Medicine

## 2017-04-30 DIAGNOSIS — F4322 Adjustment disorder with anxiety: Secondary | ICD-10-CM

## 2017-04-30 DIAGNOSIS — Z789 Other specified health status: Secondary | ICD-10-CM | POA: Insufficient documentation

## 2017-04-30 DIAGNOSIS — K589 Irritable bowel syndrome without diarrhea: Secondary | ICD-10-CM | POA: Insufficient documentation

## 2017-04-30 DIAGNOSIS — T148XXA Other injury of unspecified body region, initial encounter: Secondary | ICD-10-CM | POA: Insufficient documentation

## 2017-04-30 DIAGNOSIS — J309 Allergic rhinitis, unspecified: Secondary | ICD-10-CM

## 2017-04-30 DIAGNOSIS — R7309 Other abnormal glucose: Secondary | ICD-10-CM | POA: Insufficient documentation

## 2017-04-30 DIAGNOSIS — R202 Paresthesia of skin: Secondary | ICD-10-CM | POA: Insufficient documentation

## 2017-04-30 MED ORDER — FLUTICASONE PROPIONATE 50 MCG/ACT NA SUSP: NASAL | 3 refills | Status: DC

## 2017-04-30 MED ORDER — SERTRALINE HCL 100 MG PO TABS: 100.0000 mg | ORAL_TABLET | Freq: Every day | ORAL | 3 refills | Status: DC

## 2017-04-30 MED ORDER — SERTRALINE HCL 100 MG PO TABS: 150.0000 mg | ORAL_TABLET | Freq: Every day | ORAL | 0 refills | Status: DC

## 2017-04-30 NOTE — Patient Instructions (Addendum)
Check out moderate drinking AntiHot.gl.aspx?p=md_defined Alcohol Use Disorder Alcohol use disorder is when your drinking disrupts your daily life. When you have this condition, you drink too much alcohol and you cannot control your drinking. Alcohol use disorder can cause serious problems with your physical health. It can affect your brain, heart, liver, pancreas, immune system, stomach, and intestines. Alcohol use disorder can increase your risk for certain cancers and cause problems with your mental health, such as depression, anxiety, psychosis, delirium, and dementia. People with this disorder risk hurting themselves and others. What are the causes? This condition is caused by drinking too much alcohol over time. It is not caused by drinking too much alcohol only one or two times. Some people with this condition drink alcohol to cope with or escape from negative life events. Others drink to relieve pain or symptoms of mental illness. What increases the risk? You are more likely to develop this condition if:  You have a family history of alcohol use disorder.  Your culture encourages drinking to the point of intoxication, or makes alcohol easy to get.  You had a mood or conduct disorder in childhood.  You have been a victim of abuse.  You are an adolescent and: ? You have poor grades or difficulties in school. ? Your caregivers do not talk to you about saying no to alcohol, or supervise your activities. ? You are impulsive or you have trouble with self-control.  What are the signs or symptoms? Symptoms of this condition include:  Drinkingmore than you want to.  Drinking for longer than you want to.  Trying several times to drink less or to control your drinking.  Spending a lot of time getting alcohol, drinking, or recovering from drinking.  Craving alcohol.  Having problems at work, at school, or at home due to drinking.  Having  problems in relationships due to drinking.  Drinking when it is dangerous to drink, such as before driving a car.  Continuing to drink even though you know you might have a physical or mental problem related to drinking.  Needing more and more alcohol to get the same effect you want from the alcohol (building up tolerance).  Having symptoms of withdrawal when you stop drinking. Symptoms of withdrawal include: ? Fatigue. ? Nightmares. ? Trouble sleeping. ? Depression. ? Anxiety. ? Fever. ? Seizures. ? Severe confusion. ? Feeling or seeing things that are not there (hallucinations). ? Tremors. ? Rapid heart rate. ? Rapid breathing. ? High blood pressure.  Drinking to avoid symptoms of withdrawal.  How is this diagnosed? This condition is diagnosed with an assessment. Your health care provider may start the assessment by asking three or four questions about your drinking. Your health care provider may perform a physical exam or do lab tests to see if you have physical problems resulting from alcohol use. She or he may refer you to a mental health professional for evaluation. How is this treated? Some people with alcohol use disorder are able to reduce their alcohol use to low-risk levels. Others need to completely quit drinking alcohol. When necessary, mental health professionals with specialized training in substance use treatment can help. Your health care provider can help you decide how severe your alcohol use disorder is and what type of treatment you need. The following forms of treatment are available:  Detoxification. Detoxification involves quitting drinking and using prescription medicines within the first week to help lessen withdrawal symptoms. This treatment is important for people who have had  withdrawal symptoms before and for heavy drinkers who are likely to have withdrawal symptoms. Alcohol withdrawal can be dangerous, and in severe cases, it can cause death.  Detoxification may be provided in a home, community, or primary care setting, or in a hospital or substance use treatment facility.  Counseling. This treatment is also called talk therapy. It is provided by substance use treatment counselors. A counselor can address the reasons you use alcohol and suggest ways to keep you from drinking again or to prevent problem drinking. The goals of talk therapy are to: ? Find healthy activities and ways for you to cope with stress. ? Identify and avoid the things that trigger your alcohol use. ? Help you learn how to handle cravings.  Medicines.Medicines can help treat alcohol use disorder by: ? Decreasing alcohol cravings. ? Decreasing the positive feeling you have when you drink alcohol. ? Causing an uncomfortable physical reaction when you drink alcohol (aversion therapy).  Support groups. Support groups are led by people who have quit drinking. They provide emotional support, advice, and guidance.  These forms of treatment are often combined. Some people with this condition benefit from a combination of treatments provided by specialized substance use treatment centers. Follow these instructions at home:  Take over-the-counter and prescription medicines only as told by your health care provider.  Check with your health care provider before starting any new medicines.  Ask friends and family members not to offer you alcohol.  Avoid situations where alcohol is served, including gatherings where others are drinking alcohol.  Create a plan for what to do when you are tempted to use alcohol.  Find hobbies or activities that you enjoy that do not include alcohol.  Keep all follow-up visits as told by your health care provider. This is important. How is this prevented?  If you drink, limit alcohol intake to no more than 1 drink a day for nonpregnant women and 2 drinks a day for men. One drink equals 12 oz of beer, 5 oz of wine, or 1 oz of hard  liquor.  If you have a mental health condition, get treatment and support.  Do not give alcohol to adolescents.  If you are an adolescent: ? Do not drink alcohol. ? Do not be afraid to say no if someone offers you alcohol. Speak up about why you do not want to drink. You can be a positive role model for your friends and set a good example for those around you by not drinking alcohol. ? If your friends drink, spend time with others who do not drink alcohol. Make new friends who do not use alcohol. ? Find healthy ways to manage stress and emotions, such as meditation or deep breathing, exercise, spending time in nature, listening to music, or talking with a trusted friend or family member. Contact a health care provider if:  You are not able to take your medicines as told.  Your symptoms get worse.  You return to drinking alcohol (relapse) and your symptoms get worse. Get help right away if:  You have thoughts about hurting yourself or others. If you ever feel like you may hurt yourself or others, or have thoughts about taking your own life, get help right away. You can go to your nearest emergency department or call:  Your local emergency services (911 in the [place]  A suicide crisis helpline, such as the [facility] at 646-058-0325. This is open 24 hours a day.  Summary  Alcohol  use disorder is when your drinking disrupts your daily life. When you have this condition, you drink too much alcohol and you cannot control your drinking.  Treatment may include detoxification, counseling, medicine, and support groups.  Ask friends and family members not to offer you alcohol. Avoid situations where alcohol is served.  Get help right away if you have thoughts about hurting yourself or others. This information is not intended to replace advice given to you by your health care provider. Make sure you discuss any questions you have with your health care  provider. Document Released: 04/03/2004 Document Revised: 11/22/2015 Document Reviewed: 11/22/2015 Elsevier Interactive Patient Education  [employer].

## 2017-04-30 NOTE — Progress Notes (Signed)
BP 118/74 (BP Location: Left Arm, Patient Position: Sitting, Cuff Size: Normal)   Pulse (!) 55   Temp 97.9 F (36.6 C) (Oral)   Ht 5\' 10"  (1.778 m)   Wt 173 lb 9.6 oz (78.7 kg)   LMP 09/28/2016   SpO2 99%   BMI 24.91 kg/m    Subjective:    Patient ID: Brittany Savage, female    DOB: Aug 14, 1970, 47 y.o.   MRN: [id number]  HPI: CLEOPHA INDELICATO is a 47 y.o. female  Chief Complaint  Patient presents with  . Establish Care    HPI Patient is here to establish care  She was really sick two years and that was for illness, SABA just when sick; not usual; no asthma Got a sickness right after Christmas, Dr. Nadeen Landau was her ENT; had sinus surgery years ago; does her Botox treatments, over in [place]; had ongoing congestion; had massive sinus congestion, sucked lots of pus out of her sinuses and gave her antibiotics  Allergic rhinitis; not allergic to many things, cockroaches are everywhere; nothing major, just a few grasses; that's usually enough; did allergy shots years ago  Anxiety and depression; on sertraline; was seeing Jonnie Kind, therapist; life-changing; could not walk past a towel on the floor, really helped her  She has gained 30 pounds, used to weight 137 to 140 pounds  Depression screen [facility] 2/9 04/30/2017 05/22/2016  Decreased Interest 0 0  Down, Depressed, Hopeless 0 0  PHQ - 2 Score 0 0    Relevant past medical, surgical, family and social history reviewed Past Medical History:  Diagnosis Date  . Anxiety   . Depression    Past Surgical History:  Procedure Laterality Date  . DILATION AND CURETTAGE OF UTERUS  2006  . NASAL SINUS SURGERY  2011   Family History  Problem Relation Age of Onset  . Breast cancer Mother   . Heart disease Father   mother was about early 82's with breast cancer; no other member; no ovarian cancer  Social History   Tobacco Use  . Smoking status: Never Smoker  . Smokeless tobacco: Never Used  Substance Use Topics    . Alcohol use: Yes    Alcohol/week: 12.6 oz    Types: 21 Glasses of wine per week  . Drug use: No  MD note: drinking to self-medicate her anxiety; advised to cut back, explained safe level of no more than 7 drinks per week  Interim medical history since last visit reviewed. Allergies and medications reviewed  Review of Systems Per HPI unless specifically indicated above     Objective:    BP 118/74 (BP Location: Left Arm, Patient Position: Sitting, Cuff Size: Normal)   Pulse (!) 55   Temp 97.9 F (36.6 C) (Oral)   Ht 5\' 10"  (1.778 m)   Wt 173 lb 9.6 oz (78.7 kg)   LMP 09/28/2016   SpO2 99%   BMI 24.91 kg/m   Wt Readings from Last 3 Encounters:  04/30/17 173 lb 9.6 oz (78.7 kg)  05/22/16 161 lb (73 kg)  04/02/16 164 lb (74.4 kg)    Physical Exam  Constitutional: She appears well-developed and well-nourished.  HENT:  Mouth/Throat: Mucous membranes are normal.  Eyes: EOM are normal. No scleral icterus.  Cardiovascular: Normal rate and regular rhythm.  Pulmonary/Chest: Effort normal and breath sounds normal.  Psychiatric: She has a normal mood and affect. Her behavior is normal.      Assessment & Plan:  Problem List Items Addressed This Visit      Respiratory   Allergic rhinitis   Relevant Medications   fluticasone (FLONASE) 50 MCG/ACT nasal spray     Other   Alcohol consumption of more than two drinks per day    Encouragement given; increase sertraline to help self-medication; change up some habits; incorporate husband and friends into part of the solution; refer to psychologist      Relevant Orders   Ambulatory referral to Psychology   Adjustment reaction    Will increase the SSRI as I believe she is self-medicating; advised she must cut down on alcohol with higher dose of SSRI; close f/u; call before next visit with any problems      Relevant Medications   sertraline (ZOLOFT) 100 MG tablet       Follow up plan: Return in about 4 weeks (around  05/25/2017) for complete physical.  An after-visit summary was printed and given to the patient at [facility].  Please see the patient instructions which may contain other information and recommendations beyond what is mentioned above in the assessment and plan.  Meds ordered this encounter  Medications  . DISCONTD: sertraline (ZOLOFT) 100 MG tablet    Sig: Take 1 tablet (100 mg total) by mouth daily.    Dispense:  90 tablet    Refill:  3    Pt needs to schedule an office visit before anymore refills.  . fluticasone (FLONASE) 50 MCG/ACT nasal spray    Sig: USE TWO PUFFS INTO EACH NOSTRIL DAILY    Dispense:  48 g    Refill:  3  . sertraline (ZOLOFT) 100 MG tablet    Sig: Take 1.5 tablets (150 mg total) by mouth daily.    Dispense:  135 tablet    Refill:  0    Cancel the 100 mg daily, increase to 150 mg daily    Orders Placed This Encounter  Procedures  . Ambulatory referral to Psychology

## 2017-04-30 NOTE — Assessment & Plan Note (Signed)
Encouragement given; increase sertraline to help self-medication; change up some habits; incorporate husband and friends into part of the solution; refer to psychologist

## 2017-05-07 NOTE — Assessment & Plan Note (Signed)
Will increase the SSRI as I believe she is self-medicating; advised she must cut down on alcohol with higher dose of SSRI; close f/u; call before next visit with any problems

## 2017-05-13 DIAGNOSIS — Z01419 Encounter for gynecological examination (general) (routine) without abnormal findings: Secondary | ICD-10-CM | POA: Diagnosis not present

## 2017-05-13 DIAGNOSIS — Z6824 Body mass index (BMI) 24.0-24.9, adult: Secondary | ICD-10-CM | POA: Diagnosis not present

## 2017-05-13 DIAGNOSIS — Z124 Encounter for screening for malignant neoplasm of cervix: Secondary | ICD-10-CM | POA: Diagnosis not present

## 2017-05-13 DIAGNOSIS — Z1231 Encounter for screening mammogram for malignant neoplasm of breast: Secondary | ICD-10-CM | POA: Diagnosis not present

## 2017-06-09 ENCOUNTER — Encounter: Payer: BLUE CROSS/BLUE SHIELD | Admitting: Family Medicine

## 2017-06-17 DIAGNOSIS — M7541 Impingement syndrome of right shoulder: Secondary | ICD-10-CM | POA: Diagnosis not present

## 2017-07-14 DIAGNOSIS — L710 Perioral dermatitis: Secondary | ICD-10-CM | POA: Diagnosis not present

## 2017-07-31 ENCOUNTER — Other Ambulatory Visit: Payer: Self-pay | Admitting: Family Medicine

## 2017-07-31 DIAGNOSIS — F4322 Adjustment disorder with anxiety: Secondary | ICD-10-CM

## 2017-07-31 NOTE — Telephone Encounter (Signed)
Patient did not keep her appt for f/u She has an appt coming up 08/13/17 I'll approve just 30 days, needs appt

## 2017-08-10 DIAGNOSIS — M25511 Pain in right shoulder: Secondary | ICD-10-CM | POA: Diagnosis not present

## 2017-08-10 DIAGNOSIS — M7541 Impingement syndrome of right shoulder: Secondary | ICD-10-CM | POA: Diagnosis not present

## 2017-08-13 ENCOUNTER — Encounter

## 2017-08-13 ENCOUNTER — Encounter: Payer: BLUE CROSS/BLUE SHIELD | Admitting: Family Medicine

## 2017-08-13 LAB — HM PAP SMEAR: HM PAP: NORMAL

## 2017-08-14 DIAGNOSIS — M25511 Pain in right shoulder: Secondary | ICD-10-CM | POA: Diagnosis not present

## 2017-08-14 DIAGNOSIS — M7541 Impingement syndrome of right shoulder: Secondary | ICD-10-CM | POA: Diagnosis not present

## 2017-08-21 DIAGNOSIS — M25511 Pain in right shoulder: Secondary | ICD-10-CM | POA: Diagnosis not present

## 2017-08-21 DIAGNOSIS — M7541 Impingement syndrome of right shoulder: Secondary | ICD-10-CM | POA: Diagnosis not present

## 2017-08-26 DIAGNOSIS — M25511 Pain in right shoulder: Secondary | ICD-10-CM | POA: Diagnosis not present

## 2017-08-26 DIAGNOSIS — M7541 Impingement syndrome of right shoulder: Secondary | ICD-10-CM | POA: Diagnosis not present

## 2017-09-03 DIAGNOSIS — M7541 Impingement syndrome of right shoulder: Secondary | ICD-10-CM | POA: Diagnosis not present

## 2017-09-03 DIAGNOSIS — M25511 Pain in right shoulder: Secondary | ICD-10-CM | POA: Diagnosis not present

## 2017-09-25 ENCOUNTER — Encounter: Payer: BLUE CROSS/BLUE SHIELD | Admitting: Family Medicine

## 2017-09-30 DIAGNOSIS — M9902 Segmental and somatic dysfunction of thoracic region: Secondary | ICD-10-CM | POA: Diagnosis not present

## 2017-09-30 DIAGNOSIS — M542 Cervicalgia: Secondary | ICD-10-CM | POA: Diagnosis not present

## 2017-09-30 DIAGNOSIS — M9901 Segmental and somatic dysfunction of cervical region: Secondary | ICD-10-CM | POA: Diagnosis not present

## 2017-09-30 DIAGNOSIS — M546 Pain in thoracic spine: Secondary | ICD-10-CM | POA: Diagnosis not present

## 2017-10-01 DIAGNOSIS — M9901 Segmental and somatic dysfunction of cervical region: Secondary | ICD-10-CM | POA: Diagnosis not present

## 2017-10-01 DIAGNOSIS — M9902 Segmental and somatic dysfunction of thoracic region: Secondary | ICD-10-CM | POA: Diagnosis not present

## 2017-10-01 DIAGNOSIS — M546 Pain in thoracic spine: Secondary | ICD-10-CM | POA: Diagnosis not present

## 2017-10-01 DIAGNOSIS — M542 Cervicalgia: Secondary | ICD-10-CM | POA: Diagnosis not present

## 2017-10-08 DIAGNOSIS — M542 Cervicalgia: Secondary | ICD-10-CM | POA: Diagnosis not present

## 2017-10-08 DIAGNOSIS — M9901 Segmental and somatic dysfunction of cervical region: Secondary | ICD-10-CM | POA: Diagnosis not present

## 2017-10-08 DIAGNOSIS — M546 Pain in thoracic spine: Secondary | ICD-10-CM | POA: Diagnosis not present

## 2017-10-08 DIAGNOSIS — M9902 Segmental and somatic dysfunction of thoracic region: Secondary | ICD-10-CM | POA: Diagnosis not present

## 2017-10-16 DIAGNOSIS — M9902 Segmental and somatic dysfunction of thoracic region: Secondary | ICD-10-CM | POA: Diagnosis not present

## 2017-10-16 DIAGNOSIS — M9901 Segmental and somatic dysfunction of cervical region: Secondary | ICD-10-CM | POA: Diagnosis not present

## 2017-10-16 DIAGNOSIS — M542 Cervicalgia: Secondary | ICD-10-CM | POA: Diagnosis not present

## 2017-10-16 DIAGNOSIS — M546 Pain in thoracic spine: Secondary | ICD-10-CM | POA: Diagnosis not present

## 2017-11-03 ENCOUNTER — Other Ambulatory Visit: Payer: Self-pay | Admitting: Family Medicine

## 2017-11-03 DIAGNOSIS — F4322 Adjustment disorder with anxiety: Secondary | ICD-10-CM

## 2017-11-04 ENCOUNTER — Ambulatory Visit: Payer: BLUE CROSS/BLUE SHIELD | Admitting: Sports Medicine

## 2017-11-04 VITALS — BP 110/74 | Ht 70.0 in | Wt 170.0 lb

## 2017-11-04 DIAGNOSIS — M7741 Metatarsalgia, right foot: Secondary | ICD-10-CM

## 2017-11-04 DIAGNOSIS — M7742 Metatarsalgia, left foot: Secondary | ICD-10-CM

## 2017-11-04 NOTE — Progress Notes (Signed)
Subjective: Brittany Savage is a 47 year old female with history of metatarsalgia who presents for custom orthotics.  Last seen in 2011 when she had custom orthotics made.  She states that the orthotics have been comfortable and have been decreasing the pain in her feet.  She states they are beginning to wear out, she has noticed some pain around the base of her metatarsals.  She denies numbness or tingling in her feet she denies any weakness.  She states she feels like the support is wearing out the metatarsal pad orthotics.  Objective: General: No apparent distress, oriented x3  Bilateral foot exam: Cavus planus in bilateral feet.  Loss of transverse arch bilaterally with splaying of second and third toes.  Callus under the second metatarsal head bilaterally.  No tenderness to palpation on exam.  Full range of motion throughout foot exam.  5 out of 5 strength throughout but exam.  2+ pedal pulses bilaterally.  Neuro exam: Patient is neurovascular intact.  Sensation intact.  Assessment and plan:  Bilateral metatarsalgia  Patient was fitted for a : standard, cushioned, semi-rigid orthotic. The orthotic was heated, placed on the orthotic stand. The patient was positioned in subtalar neutral position and 10 degrees of ankle dorsiflexion in a weight bearing stance on the heated orthotic blank After completion of molding, a stable base was applied to the orthotic blank. The blank was ground to a stable position for weight bearing. Blank: Size 7 women's red Base: Blue EVA foam Posting: None Additional orthotic padding: Metatarsal pad on both orthotics  She has done well with her previous orthotics.  We have provided new metatarsal pads on these orthotics.  We have also created a new pair of custom orthotics today with metatarsal pad as noted above.  She can follow-up as needed in this clinic.  Face to face time spent was 45 minutes. This time  included evaluation of foot pathology, associated joint issues  and gait abnormalities.    Greater than 50% of our face to face time was spent in counseling regarding foot/ ankle /associated joint problems and gait issues, discussion of therapeutic options, measurement and manufacture of custom molded orthotic as detailed above.

## 2017-11-26 ENCOUNTER — Encounter: Payer: Self-pay | Admitting: Family Medicine

## 2017-11-26 ENCOUNTER — Ambulatory Visit (INDEPENDENT_AMBULATORY_CARE_PROVIDER_SITE_OTHER): Payer: BLUE CROSS/BLUE SHIELD | Admitting: Family Medicine

## 2017-11-26 VITALS — BP 116/78 | HR 55 | Temp 98.3°F | Ht 70.0 in | Wt 168.1 lb

## 2017-11-26 DIAGNOSIS — Z23 Encounter for immunization: Secondary | ICD-10-CM

## 2017-11-26 DIAGNOSIS — F4322 Adjustment disorder with anxiety: Secondary | ICD-10-CM | POA: Diagnosis not present

## 2017-11-26 DIAGNOSIS — Z789 Other specified health status: Secondary | ICD-10-CM | POA: Diagnosis not present

## 2017-11-26 DIAGNOSIS — Z0000 Encounter for general adult medical examination without abnormal findings: Secondary | ICD-10-CM | POA: Diagnosis not present

## 2017-11-26 LAB — COMPLETE METABOLIC PANEL WITH GFR
AG RATIO: 1.8 (calc) (ref 1.0–2.5)
ALBUMIN MSPROF: 4.6 g/dL (ref 3.6–5.1)
ALT: 15 U/L (ref 6–29)
AST: 22 U/L (ref 10–35)
Alkaline phosphatase (APISO): 46 U/L (ref 33–115)
BUN: 10 mg/dL (ref 7–25)
CALCIUM: 9.2 mg/dL (ref 8.6–10.2)
CO2: 26 mmol/L (ref 20–32)
Chloride: 101 mmol/L (ref 98–110)
Creat: 0.77 mg/dL (ref 0.50–1.10)
GFR, EST AFRICAN AMERICAN: 106 mL/min/{1.73_m2} (ref >=60)
GFR, EST NON AFRICAN AMERICAN: 91 mL/min/{1.73_m2} (ref >=60)
Globulin: 2.6 g/dL (calc) (ref 1.9–3.7)
Glucose, Bld: 88 mg/dL (ref 65–139)
POTASSIUM: 4.2 mmol/L (ref 3.5–5.3)
Sodium: 136 mmol/L (ref 135–146)
TOTAL PROTEIN: 7.2 g/dL (ref 6.1–8.1)
Total Bilirubin: 0.6 mg/dL (ref 0.2–1.2)

## 2017-11-26 LAB — CBC WITH DIFFERENTIAL/PLATELET
BASOS PCT: 0.6 %
Basophils Absolute: 43 cells/uL (ref 0–200)
EOS ABS: 58 {cells}/uL (ref 15–500)
Eosinophils Relative: 0.8 %
HEMATOCRIT: 39.8 % (ref 35.0–45.0)
HEMOGLOBIN: 13.3 g/dL (ref 11.7–15.5)
LYMPHS ABS: 1742 {cells}/uL (ref 850–3900)
MCH: 31.2 pg (ref 27.0–33.0)
MCHC: 33.4 g/dL (ref 32.0–36.0)
MCV: 93.4 fL (ref 80.0–100.0)
MPV: 11.7 fL (ref 7.5–12.5)
Monocytes Relative: 7.5 %
NEUTROS ABS: 4817 {cells}/uL (ref 1500–7800)
Neutrophils Relative %: 66.9 %
Platelets: 183 10*3/uL (ref 140–400)
RBC: 4.26 10*6/uL (ref 3.80–5.10)
RDW: 11.6 % (ref 11.0–15.0)
Total Lymphocyte: 24.2 %
WBC: 7.2 10*3/uL (ref 3.8–10.8)
WBCMIX: 540 {cells}/uL (ref 200–950)

## 2017-11-26 LAB — TSH: TSH: 0.93 m[IU]/L

## 2017-11-26 LAB — LIPID PANEL
Cholesterol: 158 mg/dL (ref <200)
HDL: 66 mg/dL (ref >50)
LDL CHOLESTEROL (CALC): 76 mg/dL
NON-HDL CHOLESTEROL (CALC): 92 mg/dL (ref <130)
Total CHOL/HDL Ratio: 2.4 (calc) (ref <5.0)
Triglycerides: 77 mg/dL (ref <150)

## 2017-11-26 MED ORDER — SERTRALINE HCL 100 MG PO TABS: 100.0000 mg | ORAL_TABLET | Freq: Every day | ORAL | 1 refills | Status: DC

## 2017-11-26 NOTE — Assessment & Plan Note (Signed)
USPSTF grade A and B recommendations reviewed with patient; age-appropriate recommendations, preventive care, screening tests, etc discussed and encouraged; healthy living encouraged; see AVS for patient education given to patient  

## 2017-11-26 NOTE — Progress Notes (Signed)
Patient ID: Brittany Savage, female   DOB: Jul 21, 1970, 47 y.o.   MRN: [id number]   Subjective:   Brittany Savage is a 47 y.o. female here for a complete physical exam  Interim issues since last visit: she was on antibotics from her ENT; sinus infection and allergies; completed antibiotic four days ago; no fevers; no chills; no recent travel; takes [place] for allergies but ran out  USPSTF grade A and B recommendations Depression:  Depression screen [facility] 2/9 11/26/2017 11/26/2017 04/30/2017 05/22/2016  Decreased Interest 0 0 0 0  Down, Depressed, Hopeless 0 0 0 0  PHQ - 2 Score 0 0 0 0  Altered sleeping 3 - - -  Tired, decreased energy 3 - - -  Change in appetite 0 - - -  Feeling bad or failure about yourself  0 - - -  Trouble concentrating 0 - - -  Moving slowly or fidgety/restless 0 - - -  Suicidal thoughts 0 - - -  PHQ-9 Score 6 - - -   Hypertension: BP Readings from Last 3 Encounters:  11/26/17 116/78  11/04/17 110/74  04/30/17 118/74   Obesity: down five pounds since Feb; it just happened but it needs to continue; goes to the gym, eating better Wt Readings from Last 3 Encounters:  11/26/17 168 lb 1.6 oz (76.2 kg)  11/04/17 170 lb (77.1 kg)  04/30/17 173 lb 9.6 oz (78.7 kg)   BMI Readings from Last 3 Encounters:  11/26/17 24.12 kg/m  11/04/17 24.39 kg/m  04/30/17 24.91 kg/m    Skin cancer: nothing worrisome Lung cancer:  nonsmoker Breast cancer: sees GYN, doing breast exams; just had mammogram Colorectal cancer: grandmother had colon cancer (paternal side) Cervical cancer screening: UTD, 2017 BRCA gene screening: family hx of breast and/or ovarian cancer and/or metastatic prostate cancer?  HIV, hep B, hep C:  STD testing and prevention (chl/gon/syphilis):  Intimate partner violence: no abuse Contraception: s/p ablation, not aware of risk of pregnancy until I just told her; husband vasectomy Osteoporosis: n/a Fall prevention/vitamin D: discussed; out with the  kids Immunizations: flu shot Diet: eating better; fruits and veggies, 3-4 a day Exercise: going to the gym, five days a week Alcohol: I asked her about this; she was referred to the place at the hospital, but did not go there; they only do walk-ins; no appts, just walk-ins; she is doing better she says; she will fall back into old habits; she does not want to work with a Social worker; she did not want information today on alcohol use d/o   [facility] Visit from 11/26/2017 in [facility]  AUDIT-C Score  5    Tobacco use: non AAA: n/a Aspirin: n/a Glucose:  Glucose  Date Value Ref Range Status  05/22/2016 84 65 - 99 mg/dL Final   Lipids:  Lab Results  Component Value Date   CHOL 187 05/22/2016   Lab Results  Component Value Date   HDL 72 05/22/2016   Lab Results  Component Value Date   LDLCALC 93 05/22/2016   Lab Results  Component Value Date   TRIG 109 05/22/2016   Lab Results  Component Value Date   CHOLHDL 2.6 05/22/2016   No results found for: LDLDIRECT  Past Medical History:  Diagnosis Date  . Anxiety   . Depression    Past Surgical History:  Procedure Laterality Date  . DILATION AND CURETTAGE OF UTERUS  2006  . NASAL SINUS SURGERY  2011   Family History  Problem Relation Age of Onset  . Breast cancer Mother   . Heart disease Father    Social History   Tobacco Use  . Smoking status: Never Smoker  . Smokeless tobacco: Never Used  Substance Use Topics  . Alcohol use: Yes    Alcohol/week: 21.0 standard drinks    Types: 21 Glasses of wine per week  . Drug use: No   Review of Systems  Objective:   Vitals:   11/26/17 1032  BP: 116/78  Pulse: (!) 55  Temp: 98.3 F (36.8 C)  SpO2: 98%  Weight: 168 lb 1.6 oz (76.2 kg)  Height: _0  (1.778 m)   Body mass index is 24.12 kg/m. Wt Readings from Last 3 Encounters:  11/26/17 168 lb 1.6 oz (76.2 kg)  11/04/17 170 lb (77.1 kg)  04/30/17 173 lb 9.6 oz (78.7 kg)   Physical Exam   Constitutional: She appears well-developed and well-nourished.  HENT:  Head: Normocephalic and atraumatic.  Right Ear: Hearing, tympanic membrane, external ear and ear canal normal.  Left Ear: Hearing, tympanic membrane, external ear and ear canal normal.  Eyes: Conjunctivae and EOM are normal. Right eye exhibits no hordeolum. Left eye exhibits no hordeolum. No scleral icterus.  Neck: Carotid bruit is not present. No thyromegaly present.  Cardiovascular: Normal rate, regular rhythm, S1 normal, S2 normal and normal heart sounds.  No extrasystoles are present.  Pulmonary/Chest: Effort normal and breath sounds normal. No respiratory distress.  Breast exam deferred to gyn  Abdominal: Soft. Normal appearance and bowel sounds are normal. She exhibits no distension, no abdominal bruit, no pulsatile midline mass and no mass. There is no hepatosplenomegaly. There is no tenderness. No hernia.  Genitourinary:  Genitourinary Comments: Exam deferred to gyn  Musculoskeletal: Normal range of motion. She exhibits no edema.  Lymphadenopathy:       Head (right side): No submandibular adenopathy present.       Head (left side): No submandibular adenopathy present.    She has no cervical adenopathy.    She has no axillary adenopathy.  Neurological: She is alert. She displays no tremor. No cranial nerve deficit. She exhibits normal muscle tone. Gait normal.  Reflex Scores:      Patellar reflexes are 2+ on the right side and 2+ on the left side. Skin: Skin is warm and dry. No bruising and no ecchymosis noted. No cyanosis. No pallor.  Psychiatric: Her speech is normal and behavior is normal. Thought content normal. Her mood appears not anxious. She does not exhibit a depressed mood.    Assessment/Plan:   Problem List Items Addressed This Visit      Other   Adjustment reaction   Relevant Medications   sertraline (ZOLOFT) 100 MG tablet   Preventative health care - Primary    USPSTF grade A and B  recommendations reviewed with patient; age-appropriate recommendations, preventive care, screening tests, etc discussed and encouraged; healthy living encouraged; see AVS for patient education given to patient       Relevant Orders   CBC with Differential/Platelet   COMPLETE METABOLIC PANEL WITH GFR   Lipid panel   TSH   Alcohol consumption of more than two drinks per day    I am here to help if/when desired; she declined information in AVS for alcohol use disorder; explained no judgment, can be inherited health condition, help is available       Other Visit Diagnoses    Flu vaccine need  Relevant Orders   Flu Vaccine QUAD 36+ mos IM (Completed)       Meds ordered this encounter  Medications  . sertraline (ZOLOFT) 100 MG tablet    Sig: Take 1 tablet (100 mg total) by mouth daily.    Dispense:  90 tablet    Refill:  1    Will refill again by PCP at appt   Orders Placed This Encounter  Procedures  . Flu Vaccine QUAD 36+ mos IM  . CBC with Differential/Platelet  . COMPLETE METABOLIC PANEL WITH GFR  . Lipid panel  . TSH    Follow up plan: Return in about 1 year (around 11/27/2018) for complete physical.  An After Visit Summary was printed and given to the patient.

## 2017-11-26 NOTE — Assessment & Plan Note (Signed)
I am here to help if/when desired; she declined information in AVS for alcohol use disorder; explained no judgment, can be inherited health condition, help is available

## 2017-11-26 NOTE — Patient Instructions (Addendum)
Advise no dairy for two weeks mucinex may be helpful Call your ENT if not getting Let's get labs today Health Maintenance, Female Adopting a healthy lifestyle and getting preventive care can go a long way to promote health and wellness. Talk with your health care provider about what schedule of regular examinations is right for you. This is a good chance for you to check in with your provider about disease prevention and staying healthy. In between checkups, there are plenty of things you can do on your own. Experts have done a lot of research about which lifestyle changes and preventive measures are most likely to keep you healthy. Ask your health care provider for more information. Weight and diet Eat a healthy diet  Be sure to include plenty of vegetables, fruits, low-fat dairy products, and lean protein.  Do not eat a lot of foods high in solid fats, added sugars, or salt.  Get regular exercise. This is one of the most important things you can do for your health. ? Most adults should exercise for at least 150 minutes each week. The exercise should increase your heart rate and make you sweat (moderate-intensity exercise). ? Most adults should also do strengthening exercises at least twice a week. This is in addition to the moderate-intensity exercise.  Maintain a healthy weight  Body mass index (BMI) is a measurement that can be used to identify possible weight problems. It estimates body fat based on height and weight. Your health care provider can help determine your BMI and help you achieve or maintain a healthy weight.  For females 25 years of age and older: ? A BMI below 18.5 is considered underweight. ? A BMI of 18.5 to 24.9 is normal. ? A BMI of 25 to 29.9 is considered overweight. ? A BMI of 30 and above is considered obese.  Watch levels of cholesterol and blood lipids  You should start having your blood tested for lipids and cholesterol at 49 years of age, then have this  test every 5 years.  You may need to have your cholesterol levels checked more often if: ? Your lipid or cholesterol levels are high. ? You are older than 49 years of age. ? You are at high risk for heart disease.  Cancer screening Lung Cancer  Lung cancer screening is recommended for adults 60-88 years old who are at high risk for lung cancer because of a history of smoking.  A yearly low-dose CT scan of the lungs is recommended for people who: ? Currently smoke. ? Have quit within the past 15 years. ? Have at least a 30-pack-year history of smoking. A pack year is smoking an average of one pack of cigarettes a day for 1 year.  Yearly screening should continue until it has been 15 years since you quit.  Yearly screening should stop if you develop a health problem that would prevent you from having lung cancer treatment.  Breast Cancer  Practice breast self-awareness. This means understanding how your breasts normally appear and feel.  It also means doing regular breast self-exams. Let your health care provider know about any changes, no matter how small.  If you are in your 20s or 30s, you should have a clinical breast exam (CBE) by a health care provider every 1-3 years as part of a regular health exam.  If you are 29 or older, have a CBE every year. Also consider having a breast X-ray (mammogram) every year.  If you have a family  history of breast cancer, talk to your health care provider about genetic screening.  If you are at high risk for breast cancer, talk to your health care provider about having an MRI and a mammogram every year.  Breast cancer gene (BRCA) assessment is recommended for women who have family members with BRCA-related cancers. BRCA-related cancers include: ? Breast. ? Ovarian. ? Tubal. ? Peritoneal cancers.  Results of the assessment will determine the need for genetic counseling and BRCA1 and BRCA2 testing.  Cervical Cancer Your health care  provider may recommend that you be screened regularly for cancer of the pelvic organs (ovaries, uterus, and vagina). This screening involves a pelvic examination, including checking for microscopic changes to the surface of your cervix (Pap test). You may be encouraged to have this screening done every 3 years, beginning at age 66.  For women ages 96-65, health care providers may recommend pelvic exams and Pap testing every 3 years, or they may recommend the Pap and pelvic exam, combined with testing for human papilloma virus (HPV), every 5 years. Some types of HPV increase your risk of cervical cancer. Testing for HPV may also be done on women of any age with unclear Pap test results.  Other health care providers may not recommend any screening for nonpregnant women who are considered low risk for pelvic cancer and who do not have symptoms. Ask your health care provider if a screening pelvic exam is right for you.  If you have had past treatment for cervical cancer or a condition that could lead to cancer, you need Pap tests and screening for cancer for at least 20 years after your treatment. If Pap tests have been discontinued, your risk factors (such as having a new sexual partner) need to be reassessed to determine if screening should resume. Some women have medical problems that increase the chance of getting cervical cancer. In these cases, your health care provider may recommend more frequent screening and Pap tests.  Colorectal Cancer  This type of cancer can be detected and often prevented.  Routine colorectal cancer screening usually begins at 49 years of age and continues through 49 years of age.  Your health care provider may recommend screening at an earlier age if you have risk factors for colon cancer.  Your health care provider may also recommend using home test kits to check for hidden blood in the stool.  A small camera at the end of a tube can be used to examine your colon  directly (sigmoidoscopy or colonoscopy). This is done to check for the earliest forms of colorectal cancer.  Routine screening usually begins at age 46.  Direct examination of the colon should be repeated every 5-10 years through 49 years of age. However, you may need to be screened more often if early forms of precancerous polyps or small growths are found.  Skin Cancer  Check your skin from head to toe regularly.  Tell your health care provider about any new moles or changes in moles, especially if there is a change in a mole's shape or color.  Also tell your health care provider if you have a mole that is larger than the size of a pencil eraser.  Always use sunscreen. Apply sunscreen liberally and repeatedly throughout the day.  Protect yourself by wearing long sleeves, pants, a wide-brimmed hat, and sunglasses whenever you are outside.  Heart disease, diabetes, and high blood pressure  High blood pressure causes heart disease and increases the risk of stroke.  High blood pressure is more likely to develop in: ? People who have blood pressure in the high end of the normal range (130-139/85-89 mm Hg). ? People who are overweight or obese. ? People who are African American.  If you are 69-78 years of age, have your blood pressure checked every 3-5 years. If you are 59 years of age or older, have your blood pressure checked every year. You should have your blood pressure measured twice-once when you are at a hospital or clinic, and once when you are not at a hospital or clinic. Record the average of the two measurements. To check your blood pressure when you are not at a hospital or clinic, you can use: ? An automated blood pressure machine at a pharmacy. ? A home blood pressure monitor.  If you are between 71 years and 74 years old, ask your health care provider if you should take aspirin to prevent strokes.  Have regular diabetes screenings. This involves taking a blood sample to  check your fasting blood sugar level. ? If you are at a normal weight and have a low risk for diabetes, have this test once every three years after 49 years of age. ? If you are overweight and have a high risk for diabetes, consider being tested at a younger age or more often. Preventing infection Hepatitis B  If you have a higher risk for hepatitis B, you should be screened for this virus. You are considered at high risk for hepatitis B if: ? You were born in a country where hepatitis B is common. Ask your health care provider which countries are considered high risk. ? Your parents were born in a high-risk country, and you have not been immunized against hepatitis B (hepatitis B vaccine). ? You have HIV or AIDS. ? You use needles to inject street drugs. ? You live with someone who has hepatitis B. ? You have had sex with someone who has hepatitis B. ? You get hemodialysis treatment. ? You take certain medicines for conditions, including cancer, organ transplantation, and autoimmune conditions.  Hepatitis C  Blood testing is recommended for: ? Everyone born from [birth year] through 1967. ? Anyone with known risk factors for hepatitis C.  Sexually transmitted infections (STIs)  You should be screened for sexually transmitted infections (STIs) including gonorrhea and chlamydia if: ? You are sexually active and are younger than 49 years of age. ? You are older than 49 years of age and your health care provider tells you that you are at risk for this type of infection. ? Your sexual activity has changed since you were last screened and you are at an increased risk for chlamydia or gonorrhea. Ask your health care provider if you are at risk.  If you do not have HIV, but are at risk, it may be recommended that you take a prescription medicine daily to prevent HIV infection. This is called pre-exposure prophylaxis (PrEP). You are considered at risk if: ? You are sexually active and do not regularly  use condoms or know the HIV status of your partner(s). ? You take drugs by injection. ? You are sexually active with a partner who has HIV.  Talk with your health care provider about whether you are at high risk of being infected with HIV. If you choose to begin PrEP, you should first be tested for HIV. You should then be tested every 3 months for as long as you are taking PrEP. Pregnancy  If you  are premenopausal and you may become pregnant, ask your health care provider about preconception counseling.  If you may become pregnant, take 400 to 800 micrograms (mcg) of folic acid every day.  If you want to prevent pregnancy, talk to your health care provider about birth control (contraception). Osteoporosis and menopause  Osteoporosis is a disease in which the bones lose minerals and strength with aging. This can result in serious bone fractures. Your risk for osteoporosis can be identified using a bone density scan.  If you are 61 years of age or older, or if you are at risk for osteoporosis and fractures, ask your health care provider if you should be screened.  Ask your health care provider whether you should take a calcium or vitamin D supplement to lower your risk for osteoporosis.  Menopause may have certain physical symptoms and risks.  Hormone replacement therapy may reduce some of these symptoms and risks. Talk to your health care provider about whether hormone replacement therapy is right for you. Follow these instructions at home:  Schedule regular health, dental, and eye exams.  Stay current with your immunizations.  Do not use any tobacco products including cigarettes, chewing tobacco, or electronic cigarettes.  If you are pregnant, do not drink alcohol.  If you are breastfeeding, limit how much and how often you drink alcohol.  Limit alcohol intake to no more than 1 drink per day for nonpregnant women. One drink equals 12 ounces of beer, 5 ounces of wine, or 1 ounces  of hard liquor.  Do not use street drugs.  Do not share needles.  Ask your health care provider for help if you need support or information about quitting drugs.  Tell your health care provider if you often feel depressed.  Tell your health care provider if you have ever been abused or do not feel safe at home. This information is not intended to replace advice given to you by your health care provider. Make sure you discuss any questions you have with your health care provider. Document Released: 09/09/2010 Document Revised: 08/02/2015 Document Reviewed: 11/28/2014 Elsevier Interactive Patient Education  [employer].

## 2017-12-09 ENCOUNTER — Ambulatory Visit: Payer: Self-pay | Admitting: *Deleted

## 2017-12-09 ENCOUNTER — Ambulatory Visit
Admission: RE | Admit: 2017-12-09 | Discharge: 2017-12-09 | Disposition: A | Discharge status: Home / Self Care | Payer: BLUE CROSS/BLUE SHIELD | Source: Ambulatory Visit | Attending: Family Medicine | Admitting: Family Medicine

## 2017-12-09 ENCOUNTER — Encounter: Payer: Self-pay | Admitting: Family Medicine

## 2017-12-09 ENCOUNTER — Ambulatory Visit: Payer: BLUE CROSS/BLUE SHIELD | Admitting: Family Medicine

## 2017-12-09 ENCOUNTER — Other Ambulatory Visit: Payer: Self-pay | Admitting: Family Medicine

## 2017-12-09 VITALS — BP 120/70 | HR 69 | Temp 98.7°F | Resp 16 | Ht 70.0 in | Wt 168.0 lb

## 2017-12-09 DIAGNOSIS — M7989 Other specified soft tissue disorders: Secondary | ICD-10-CM | POA: Insufficient documentation

## 2017-12-09 DIAGNOSIS — S79922A Unspecified injury of left thigh, initial encounter: Secondary | ICD-10-CM

## 2017-12-09 DIAGNOSIS — S7012XA Contusion of left thigh, initial encounter: Secondary | ICD-10-CM

## 2017-12-09 DIAGNOSIS — M79605 Pain in left leg: Secondary | ICD-10-CM | POA: Diagnosis not present

## 2017-12-09 DIAGNOSIS — S8012XA Contusion of left lower leg, initial encounter: Secondary | ICD-10-CM | POA: Diagnosis not present

## 2017-12-09 NOTE — Patient Instructions (Addendum)
[facility] - Walk in [facility] is 1p-7p Monday-Friday - If the [place] is negative, and you are not improving in 1 week, you need to go to [facility].  Contusion A contusion is a deep bruise. Contusions happen when an injury causes bleeding under the skin. Symptoms of bruising include pain, swelling, and discolored skin. The skin may turn blue, purple, or yellow. Follow these instructions at home:  Rest the injured area.  If told, put ice on the injured area. ? Put ice in a plastic bag. ? Place a towel between your skin and the bag. ? Leave the ice on for 20 minutes, 2-3 times per day.  If told, put light pressure (compression) on the injured area using an elastic bandage. Make sure the bandage is not too tight. Remove it and put it back on as told by your doctor.  If possible, raise (elevate) the injured area above the level of your heart while you are sitting or lying down.  Take over-the-counter and prescription medicines only as told by your doctor. Contact a doctor if:  Your symptoms do not get better after several days of treatment.  Your symptoms get worse.  You have trouble moving the injured area. Get help right away if:  You have very bad pain.  You have a loss of feeling (numbness) in a hand or foot.  Your hand or foot turns pale or cold. This information is not intended to replace advice given to you by your health care provider. Make sure you discuss any questions you have with your health care provider. Document Released: 08/13/2007 Document Revised: 08/02/2015 Document Reviewed: 07/12/2014 Elsevier Interactive Patient Education  2018 [employer].

## 2017-12-09 NOTE — Progress Notes (Signed)
Name: Brittany Savage   MRN: [id number]    DOB: [birth year]   Date:12/09/2017       Progress Note  Subjective  Chief Complaint  Chief Complaint  Patient presents with  . Leg Pain    left leg pain in groin are, feels tight, purple in color bruises  after horseback riding    HPI  Pt presents with concern for large LEFT inner thigh bruise that developed over the course of several days while riding horses and exercising. Saturday 12/05/17, she got off of the horse, had some pain to the LEFT groin/inner thigh, over the course of two hours she had a large, hardened area that was not discolored and had a tight feeling.  She applied ice.  The next day she went to yoga, felt a little discomfort but nothing too bad.  2 days later (yesterday) she went to [employer] and had some discomfort, afterwards when she showered, she noticed a medium-sized bruise.  Today she has a very large bruise to the LEFT inner thigh/groin area.  She denies severe pain, is able to exercise, but does have somewhat limited ROM when she walks (she notes a slightly shortened stride). She denies weakness, numbness/tingling.  She has been taking Aleve PRN for pain.    Patient Active Problem List   Diagnosis Date Noted  . Preventative health care 11/26/2017  . Abnormal blood sugar 04/30/2017  . 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 Bite 04/30/2017  . Burning or prickling sensation 04/30/2017  . Alcohol consumption of more than two drinks per day 04/30/2017  . Allergic rhinitis 10/09/2014  . 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 Atypical chest pain 05/11/2014  . Palpitations 05/11/2014  . KNEE PAIN, LEFT 01/29/2007  . METATARSALGIA 01/29/2007  . Pain in limb 01/29/2007    Social History   Tobacco Use  . Smoking status: Never Smoker  . Smokeless tobacco: Never Used  Substance Use Topics  . Alcohol use: Yes    Alcohol/week: 21.0 standard drinks    Types: 21 Glasses of wine per week    Current Outpatient Medications:    .  fluticasone (FLONASE) 50 MCG/ACT nasal spray, USE TWO PUFFS INTO EACH NOSTRIL DAILY, Disp: 48 g, Rfl: 3 .  Omega-3 1000 MG CAPS, Take by mouth daily. , Disp: , Rfl:  .  Probiotic Product (PROBIOTIC DAILY PO), Take by mouth daily., Disp: , Rfl:  .  sertraline (ZOLOFT) 100 MG tablet, Take 1 tablet (100 mg total) by mouth daily., Disp: 90 tablet, Rfl: 1  Allergies  Allergen Reactions  . Nitrofurantoin   . Penicillins   . Sulfamethoxazole-Trimethoprim     I personally reviewed active problem list, medication list, allergies, notes from last encounter with the patient/caregiver today.  ROS  Ten systems reviewed and is negative except as mentioned in HPI.  Objective  Vitals:   12/09/17 1121  BP: 120/70  Pulse: 69  Resp: 16  Temp: 98.7 F (37.1 C)  TempSrc: Oral  SpO2: 99%  Weight: 168 lb (76.2 kg)  Height: 5\' 10"  (1.778 m)   Body mass index is 24.11 kg/m.  Nursing Note and Vital Signs reviewed.  Physical Exam  Constitutional: Patient appears well-developed and well-nourished. No distress.  HENT: Head: Normocephalic and atraumatic. Ears: bilateral TMs with no erythema or effusion; Nose: Nose normal. Mouth/Throat: Oropharynx is clear and moist. No oropharyngeal exudate or tonsillar swelling.  Eyes: Conjunctivae and EOM are normal. No scleral icterus.  Pupils are equal, round, and reactive to  light.  Neck: Normal range of motion. Neck supple. No JVD present. No thyromegaly present.  Cardiovascular: Normal rate, regular rhythm and normal heart sounds.  No murmur heard. No BLE edema. Pulmonary/Chest: Effort normal and breath sounds normal. No respiratory distress. Abdominal: Soft. Bowel sounds are normal, no distension. There is no tenderness. No masses. Musculoskeletal: Left hip has full range of motion, pelvis is stable, left upper inner thigh and groin are nontender.  Femoral popliteal and pedal pulses are +2 and regular.  Cap refill is < 3 seconds. Neurological: Pt is  alert and oriented to person, place, and time. No cranial nerve deficit. Coordination, balance, strength, speech and gait are normal.  Skin: Skin is warm and dry. No rash noted. No erythema.  There is a large ecchymotic area to the left upper inner thigh with no fluctuance, no palpable hematoma, and no tenderness. Psychiatric: Patient has a normal mood and affect. behavior is normal. Judgment and thought content normal.  No results found for this or any previous visit (from the past 72 hour(s)).  Assessment & Plan  1. Traumatic ecchymosis of left thigh, initial encounter - US Venous Img Lower Unilateral Left; Future - US Venous Img Lower Unilateral Left  2. Injury of left thigh, initial encounter - US Venous Img Lower Unilateral Left; Future - US Venous Img Lower Unilateral Left  Discussed plan of care in detail, advised we will start with ultrasound to ensure no blood clot or large hematoma requiring urgent attention.  However if she continues to have pain and limited range of motion with ambulation she must present for further evaluation at [facility].  Recommend she continue Aleve as needed, apply ice several times a day, and refrain from engaging in strenuous activities such as exercise classes and horseback riding.  -Red flags and when to present for emergency care or RTC including fever >101.82F, chest pain, shortness of breath, new/worsening/un-resolving symptoms, lower extremity weakness, numbness and tingling, severe pain, erythema reviewed with patient at time of visit. Follow up and care instructions discussed and provided in AVS.

## 2017-12-09 NOTE — Telephone Encounter (Signed)
Called c/o having a large purple hardened area in her left groin that is larger than her hand.   It does not really hurt it's just really swollen.   Noticed it 2 weeks ago after riding her horse.     See triage notes.  I scheduled her with Raelyn Ensign, FNP-C for today at 11:20.   She can be at the office by 11:05.   "I live 5 minutes away".    Reason for Disposition . Can't reduce the hernia (NO pain, local tenderness, or vomiting)  Answer Assessment - Initial Assessment Questions 1. ONSET:  "When did this first appear?"     I have a purple, black area in  My left groin that is larger than my hand.   It's very swollen.   2 weeks ago I rode horses.   This area was feeling tight.  I rode on Sunday.   When I got off over the next 2 hours it got hard and I could hardly walk.   The next day I could walk and did Yoga and worked out.    Then yesterday a big purple bruised area showed up.   It's going down my leg.    2. APPEARANCE: "What does it look like?"     See above 3. SIZE: "How big is it?" (inches, cm or compare to coins, fruit)     See above 4. LOCATION: "Where exactly is the hernia located?"     Left groin. 5. PATTERN: "Does the swelling come and go, or has it been constant since it started?"     It's still very swollen now.   I'm going out of town this afternoon.   Then back and leaving again tomorrow to go out of town again. 6. PAIN: "Is there any pain?" If so, ask: "How bad is it?"  (Scale 1-10; or mild, moderate, severe)     It's tight when I walk.   It may ache a little but sitting I don't notice it. 7. DIAGNOSIS: "Have you been seen by a doctor for this?" "Did the doctor diagnose you as having a hernia?"     No 8. OTHER SYMPTOMS: "Do you have any other symptoms?" (e.g., fever, abdominal pain, vomiting)     No 9. PREGNANCY: "Is there any chance you are pregnant?" "When was your last menstrual period?"     No!  Protocols used: HERNIA-A-AH

## 2017-12-31 DIAGNOSIS — M25511 Pain in right shoulder: Secondary | ICD-10-CM | POA: Diagnosis not present

## 2018-01-05 DIAGNOSIS — M25511 Pain in right shoulder: Secondary | ICD-10-CM | POA: Diagnosis not present

## 2018-01-07 DIAGNOSIS — M25511 Pain in right shoulder: Secondary | ICD-10-CM | POA: Diagnosis not present

## 2018-01-12 DIAGNOSIS — M25511 Pain in right shoulder: Secondary | ICD-10-CM | POA: Diagnosis not present

## 2018-04-14 DIAGNOSIS — S93402A Sprain of unspecified ligament of left ankle, initial encounter: Secondary | ICD-10-CM | POA: Diagnosis not present

## 2018-04-22 DIAGNOSIS — R262 Difficulty in walking, not elsewhere classified: Secondary | ICD-10-CM | POA: Diagnosis not present

## 2018-04-22 DIAGNOSIS — S93402D Sprain of unspecified ligament of left ankle, subsequent encounter: Secondary | ICD-10-CM | POA: Diagnosis not present

## 2018-04-28 DIAGNOSIS — S93402D Sprain of unspecified ligament of left ankle, subsequent encounter: Secondary | ICD-10-CM | POA: Diagnosis not present

## 2018-04-28 DIAGNOSIS — R262 Difficulty in walking, not elsewhere classified: Secondary | ICD-10-CM | POA: Diagnosis not present

## 2018-05-05 DIAGNOSIS — R262 Difficulty in walking, not elsewhere classified: Secondary | ICD-10-CM | POA: Diagnosis not present

## 2018-05-05 DIAGNOSIS — S93402D Sprain of unspecified ligament of left ankle, subsequent encounter: Secondary | ICD-10-CM | POA: Diagnosis not present

## 2018-05-11 DIAGNOSIS — R262 Difficulty in walking, not elsewhere classified: Secondary | ICD-10-CM | POA: Diagnosis not present

## 2018-05-11 DIAGNOSIS — S93402D Sprain of unspecified ligament of left ankle, subsequent encounter: Secondary | ICD-10-CM | POA: Diagnosis not present

## 2018-05-13 ENCOUNTER — Telehealth: Payer: Self-pay

## 2018-05-13 ENCOUNTER — Other Ambulatory Visit: Payer: Self-pay

## 2018-05-13 ENCOUNTER — Telehealth: Payer: Self-pay | Admitting: Family Medicine

## 2018-05-13 ENCOUNTER — Encounter: Payer: Self-pay | Admitting: Family Medicine

## 2018-05-13 DIAGNOSIS — Z1211 Encounter for screening for malignant neoplasm of colon: Secondary | ICD-10-CM

## 2018-05-13 NOTE — Telephone Encounter (Signed)
Gastroenterology Pre-Procedure Review  Request Date: 06/02/18 Requesting Physician: Dr. Bonna Gains  PATIENT REVIEW QUESTIONS: The patient responded to the following health history questions as indicated:    1. Are you having any GI issues? no 2. Do you have a personal history of Polyps? no 3. Do you have a family history of Colon Cancer or Polyps? yes (Grandmother Colon Cancer) 4. Diabetes Mellitus? no 5. Joint replacements in the past 12 months?no 6. Major health problems in the past 3 months?no 7. Any artificial heart valves, MVP, or defibrillator?no    MEDICATIONS & ALLERGIES:    Patient reports the following regarding taking any anticoagulation/antiplatelet therapy:   Plavix, Coumadin, Eliquis, Xarelto, Lovenox, Pradaxa, Brilinta, or Effient? no Aspirin? no  Patient confirms/reports the following medications:  Current Outpatient Medications  Medication Sig Dispense Refill  . fluticasone (FLONASE) 50 MCG/ACT nasal spray USE TWO PUFFS INTO EACH NOSTRIL DAILY 48 g 3  . Omega-3 1000 MG CAPS Take by mouth daily.     . Probiotic Product (PROBIOTIC DAILY PO) Take by mouth daily.    . sertraline (ZOLOFT) 100 MG tablet Take 1 tablet (100 mg total) by mouth daily. 90 tablet 1   No current facility-administered medications for this visit.     Patient confirms/reports the following allergies:  Allergies  Allergen Reactions  . Nitrofurantoin   . Penicillins   . Sulfamethoxazole-Trimethoprim     No orders of the defined types were placed in this encounter.   AUTHORIZATION INFORMATION Primary Insurance: 1D#: Group #:  Secondary Insurance: 1D#: Group #:  SCHEDULE INFORMATION: Date: 06/02/18 Time: Location:[facility]

## 2018-05-13 NOTE — Telephone Encounter (Signed)
Copied from [place] [id number]. Topic: General - Other >> May 13, 2018  8:47 AM Oneta Rack wrote: Relation to pt: self  Call back number: [phone]    Reason for call:  Patient requesting colonoscopy orders, patient states she will be turning 37 and she's being pro active due to her family history, please advise

## 2018-05-13 NOTE — Telephone Encounter (Signed)
Pt notified and her grandmother did

## 2018-05-13 NOTE — Telephone Encounter (Signed)
Family History  Problem Relation Age of Onset  . Breast cancer Mother   . Heart disease Father    I don't see anything in our records about colon cancer Please ask and update if that is a factor Otherwise, okay to put in referral to be scheduled on or after January 23, 2019

## 2018-05-18 DIAGNOSIS — R262 Difficulty in walking, not elsewhere classified: Secondary | ICD-10-CM | POA: Diagnosis not present

## 2018-05-18 DIAGNOSIS — S93402D Sprain of unspecified ligament of left ankle, subsequent encounter: Secondary | ICD-10-CM | POA: Diagnosis not present

## 2018-05-27 ENCOUNTER — Telehealth: Payer: Self-pay

## 2018-05-27 NOTE — Telephone Encounter (Signed)
Left voice message to inform her that due to recent restrictions with the [facility] we are unable to keep her on the schedule for 03/24 for her colonoscopy and we will call her back to reschedule.  Thanks [employer]

## 2018-06-02 ENCOUNTER — Ambulatory Visit
Admission: RE | Admit: 2018-06-02 | Payer: BLUE CROSS/BLUE SHIELD | Source: Home / Self Care | Admitting: Gastroenterology

## 2018-06-02 ENCOUNTER — Encounter: Admission: RE | Payer: Self-pay | Source: Home / Self Care

## 2018-06-02 SURGERY — COLONOSCOPY WITH PROPOFOL
Anesthesia: General

## 2018-06-03 ENCOUNTER — Other Ambulatory Visit: Payer: Self-pay | Admitting: Family Medicine

## 2018-06-03 DIAGNOSIS — J309 Allergic rhinitis, unspecified: Secondary | ICD-10-CM

## 2018-06-10 ENCOUNTER — Other Ambulatory Visit: Payer: Self-pay

## 2018-06-10 DIAGNOSIS — Z1211 Encounter for screening for malignant neoplasm of colon: Secondary | ICD-10-CM

## 2018-06-11 DIAGNOSIS — L309 Dermatitis, unspecified: Secondary | ICD-10-CM | POA: Diagnosis not present

## 2018-06-29 DIAGNOSIS — L301 Dyshidrosis [pompholyx]: Secondary | ICD-10-CM | POA: Diagnosis not present

## 2018-07-14 ENCOUNTER — Other Ambulatory Visit: Payer: Self-pay | Admitting: Nurse Practitioner

## 2018-07-14 ENCOUNTER — Other Ambulatory Visit: Payer: Self-pay | Admitting: Family Medicine

## 2018-07-14 DIAGNOSIS — F4322 Adjustment disorder with anxiety: Secondary | ICD-10-CM

## 2018-07-14 DIAGNOSIS — J309 Allergic rhinitis, unspecified: Secondary | ICD-10-CM

## 2018-07-21 DIAGNOSIS — L308 Other specified dermatitis: Secondary | ICD-10-CM | POA: Diagnosis not present

## 2018-07-21 DIAGNOSIS — L309 Dermatitis, unspecified: Secondary | ICD-10-CM | POA: Diagnosis not present

## 2018-07-21 DIAGNOSIS — D485 Neoplasm of uncertain behavior of skin: Secondary | ICD-10-CM | POA: Diagnosis not present

## 2018-08-16 ENCOUNTER — Telehealth: Payer: Self-pay

## 2018-08-16 NOTE — Telephone Encounter (Signed)
Contacted patient in regards to reschedule her 09/03/18 Colonoscopy due to scheduling changes with Dr. Michele Mcalpine schedule. Patient states that she needed to reschedule it anyway.  She has been rescheduled to 10/21/18 at [facility] still with Dr. Bonna Gains.  Informed her that she will need to have COVID19 test prior to procedure.  Will note her COVID test on procedure instructions.  Almyra Free in endoscopy has been informed.  Thanks [employer]

## 2018-08-23 DIAGNOSIS — M7541 Impingement syndrome of right shoulder: Secondary | ICD-10-CM | POA: Diagnosis not present

## 2018-08-24 DIAGNOSIS — Z1231 Encounter for screening mammogram for malignant neoplasm of breast: Secondary | ICD-10-CM | POA: Diagnosis not present

## 2018-08-24 DIAGNOSIS — Z6824 Body mass index (BMI) 24.0-24.9, adult: Secondary | ICD-10-CM | POA: Diagnosis not present

## 2018-08-24 DIAGNOSIS — Z01419 Encounter for gynecological examination (general) (routine) without abnormal findings: Secondary | ICD-10-CM | POA: Diagnosis not present

## 2018-08-24 DIAGNOSIS — Z124 Encounter for screening for malignant neoplasm of cervix: Secondary | ICD-10-CM | POA: Diagnosis not present

## 2018-08-24 DIAGNOSIS — R61 Generalized hyperhidrosis: Secondary | ICD-10-CM | POA: Diagnosis not present

## 2018-08-30 DIAGNOSIS — L239 Allergic contact dermatitis, unspecified cause: Secondary | ICD-10-CM | POA: Diagnosis not present

## 2018-09-01 DIAGNOSIS — L239 Allergic contact dermatitis, unspecified cause: Secondary | ICD-10-CM | POA: Diagnosis not present

## 2018-09-03 DIAGNOSIS — L239 Allergic contact dermatitis, unspecified cause: Secondary | ICD-10-CM | POA: Diagnosis not present

## 2018-09-29 ENCOUNTER — Telehealth: Payer: Self-pay | Admitting: Family Medicine

## 2018-09-29 DIAGNOSIS — R5383 Other fatigue: Secondary | ICD-10-CM

## 2018-09-29 NOTE — Telephone Encounter (Signed)
Ordered.  If you test positive or negative for COVID-19 on a viral or an antibody test, you still should take preventive measures to protect yourself and others. We do not know yet if people who recover from COVID-19 can get infected again.  Please make an appointment to follow-up for concerning symptoms if needed.

## 2018-09-29 NOTE — Telephone Encounter (Signed)
Patient would like to have a Covid-19 Antibody Test to find out if she HAD Covid.

## 2018-09-29 NOTE — Telephone Encounter (Signed)
Our clinic does not recommend this at this time due to lack of data on its usefulness, likely as testing improves we will change our policy

## 2018-09-29 NOTE — Addendum Note (Signed)
Addended by: Fredderick Severance on: 09/29/2018 03:51 PM   Modules accepted: Orders

## 2018-09-29 NOTE — Telephone Encounter (Signed)
Patient was sick for 14 days after a trip so [place] in March  Has seen gyno, not in menopause.  Dermatology did a full panel of labs and couldn't find anything. Patient had a rash following the sickness in March that lasted 5-6 weeks. Topical and oral steroids were given, biopsy was done. Allergy testing was completed.   Patient still has extreme fatigue and achy joints.   Advised patient that [facility] in [place] was providing covid antibody testing and she refused. She stated she spoke with [facility] and they advised her they could provide the antibody test with a doctors order.  Please advise.

## 2018-09-29 NOTE — Telephone Encounter (Signed)
Pt notified. Labs up front for pick up.

## 2018-10-01 DIAGNOSIS — R5383 Other fatigue: Secondary | ICD-10-CM | POA: Diagnosis not present

## 2018-10-03 LAB — SARS-COV-2 ANTIBODY, IGM: SARS-CoV-2 Antibody, IgM: NEGATIVE

## 2018-10-12 ENCOUNTER — Ambulatory Visit: Payer: Self-pay | Admitting: Family Medicine

## 2018-10-18 ENCOUNTER — Other Ambulatory Visit: Admission: RE | Admit: 2018-10-18 | Payer: BLUE CROSS/BLUE SHIELD | Source: Ambulatory Visit

## 2018-10-18 ENCOUNTER — Other Ambulatory Visit: Payer: Self-pay | Admitting: Nurse Practitioner

## 2018-10-18 DIAGNOSIS — F4322 Adjustment disorder with anxiety: Secondary | ICD-10-CM

## 2018-10-19 ENCOUNTER — Telehealth: Payer: Self-pay

## 2018-10-19 NOTE — Telephone Encounter (Signed)
Patients states she did not have her covid test and colonoscopy (Thurs) written on her calendar and needed to reschedule.  She has been rescheduled to 12/08/18 for colonoscopy and covid test on 12/03/18.  Thanks [employer]

## 2018-10-25 ENCOUNTER — Telehealth: Payer: Self-pay

## 2018-10-25 NOTE — Telephone Encounter (Signed)
LVM for pt to call office to see if she would be okay with another provider performing her colonoscopy on 09/30 due to Dr. Bonna Gains being off this day.  Thanks [employer]

## 2018-11-11 ENCOUNTER — Telehealth: Payer: Self-pay

## 2018-11-11 NOTE — Telephone Encounter (Signed)
Left voice message letting patient know her physician for procedure has been changed to Dr. Vicente Males from Dr. Bonna Gains.   Thanks,  Sharyn Lull

## 2018-11-29 ENCOUNTER — Encounter: Payer: BC Managed Care – PPO | Admitting: Family Medicine

## 2018-12-03 ENCOUNTER — Other Ambulatory Visit: Payer: BC Managed Care – PPO | Attending: Gastroenterology

## 2018-12-08 ENCOUNTER — Encounter: Admission: RE | Payer: Self-pay | Source: Home / Self Care

## 2018-12-08 ENCOUNTER — Ambulatory Visit
Admission: RE | Admit: 2018-12-08 | Payer: BC Managed Care – PPO | Source: Home / Self Care | Admitting: Gastroenterology

## 2018-12-08 SURGERY — COLONOSCOPY WITH PROPOFOL
Anesthesia: General

## 2018-12-28 ENCOUNTER — Other Ambulatory Visit: Payer: Self-pay | Admitting: *Deleted

## 2018-12-28 DIAGNOSIS — Z20822 Contact with and (suspected) exposure to covid-19: Secondary | ICD-10-CM

## 2018-12-28 DIAGNOSIS — Z20828 Contact with and (suspected) exposure to other viral communicable diseases: Secondary | ICD-10-CM | POA: Diagnosis not present

## 2018-12-29 LAB — NOVEL CORONAVIRUS, NAA: SARS-CoV-2, NAA: NOT DETECTED

## 2019-02-09 ENCOUNTER — Other Ambulatory Visit: Payer: Self-pay

## 2019-02-09 DIAGNOSIS — Z20822 Contact with and (suspected) exposure to covid-19: Secondary | ICD-10-CM

## 2019-02-11 LAB — NOVEL CORONAVIRUS, NAA: SARS-CoV-2, NAA: NOT DETECTED

## 2019-03-01 ENCOUNTER — Telehealth: Payer: Self-pay | Admitting: Family Medicine

## 2019-03-01 DIAGNOSIS — F4322 Adjustment disorder with anxiety: Secondary | ICD-10-CM

## 2019-03-01 MED ORDER — SERTRALINE HCL 100 MG PO TABS: ORAL_TABLET | ORAL | 0 refills | Status: DC

## 2019-03-01 NOTE — Telephone Encounter (Signed)
Rx sent 

## 2019-03-01 NOTE — Telephone Encounter (Signed)
Copied from [place] [id number]. Topic: Quick Communication - Rx Refill/Question >> Mar 01, 2019  8:32 AM Rainey Pines A wrote: Medication: sertraline (ZOLOFT) 100 MG tablet (Patient stated that pharmacy has reached out multiple times to get medication signed off on and sent back over.)  Has the patient contacted their pharmacy? {Yes (Agent: If no, request that the patient contact the pharmacy for the refill.) (Agent: If yes, when and what did the pharmacy advise?)Contact Pcp  Preferred Pharmacy (with phone number or street name): [place], [place] - [place]  Phone:  [phone] Fax:  [phone]     Agent: Please be advised that RX refills may take up to 3 business days. We ask that you follow-up with your pharmacy.

## 2019-03-01 NOTE — Telephone Encounter (Signed)
FYI,

## 2019-03-01 NOTE — Telephone Encounter (Signed)
Pt is scheduled for a Database administrator with [facility] and is requesting for this to be filled today

## 2019-03-02 ENCOUNTER — Ambulatory Visit (INDEPENDENT_AMBULATORY_CARE_PROVIDER_SITE_OTHER): Payer: BC Managed Care – PPO | Admitting: Family Medicine

## 2019-03-02 ENCOUNTER — Encounter: Payer: Self-pay | Admitting: Family Medicine

## 2019-03-02 ENCOUNTER — Other Ambulatory Visit: Payer: Self-pay

## 2019-03-02 DIAGNOSIS — F4322 Adjustment disorder with anxiety: Secondary | ICD-10-CM | POA: Diagnosis not present

## 2019-03-02 DIAGNOSIS — J309 Allergic rhinitis, unspecified: Secondary | ICD-10-CM

## 2019-03-02 MED ORDER — SERTRALINE HCL 100 MG PO TABS: ORAL_TABLET | ORAL | 3 refills | Status: DC

## 2019-03-02 NOTE — Progress Notes (Signed)
Name: Brittany Savage   MRN: [id number]    DOB: [birth year]   Date:03/02/2019       Progress Note  Subjective  Chief Complaint  Chief Complaint  Patient presents with  . Medication Refill    I connected with  PAELYN PILCH on 03/02/19 at 10:20 AM EST by telephone and verified that I am speaking with the correct person using two identifiers.  I discussed the limitations, risks, security and privacy concerns of performing an evaluation and management service by telephone and the availability of in person appointments. Staff also discussed with the patient that there may be a patient responsible charge related to this service. Patient Location: Home Provider Location: Home Additional Individuals present: None  HPI  Anxiety:  She has been taking 100mg  Zoloft for about 8 years and feels good on medication.  She denies panic attacks, crying spells, sleeping well without issues.  She would like to maintain her current medication dosing at this time.   Allergic Rhinitis: Using flonase no PO medications.  No concerns, no rhinorrhea, coughing.  - Colonoscopy - was canceled in March 2020 due to COVID-19 pandemic - she is reminded to call to reschedule when able. - Having Paps with GYN, need physical with [place].  - Mammograms through GYN - needs to sign records release, however she states these are UTD.  Patient Active Problem List   Diagnosis Date Noted  . Colon cancer screening 05/13/2018  . Preventative health care 11/26/2017  . Abnormal blood sugar 04/30/2017  . 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 Bite 04/30/2017  . Burning or prickling sensation 04/30/2017  . Alcohol consumption of more than two drinks per day 04/30/2017  . Allergic rhinitis 10/09/2014  . 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 Atypical chest pain 05/11/2014  . Palpitations 05/11/2014  . KNEE PAIN, LEFT 01/29/2007  . METATARSALGIA 01/29/2007  . Pain in limb 01/29/2007    Past Surgical History:  Procedure  Laterality Date  . DILATION AND CURETTAGE OF UTERUS  2006  . NASAL SINUS SURGERY  2011    Family History  Problem Relation Age of Onset  . Breast cancer Mother   . Heart disease Father   . Colon cancer Maternal Grandfather     Social History   Socioeconomic History  . Marital status: Married    Spouse name: Not on file  . Number of children: Not on file  . Years of education: Not on file  . Highest education level: Not on file  Occupational History  . Not on file  Tobacco Use  . Smoking status: Never Smoker  . Smokeless tobacco: Never Used  Substance and Sexual Activity  . Alcohol use: Yes    Alcohol/week: 21.0 standard drinks    Types: 21 Glasses of wine per week  . Drug use: No  . Sexual activity: Yes    Birth control/protection: None  Other Topics Concern  . Not on file  Social History Narrative  . Not on file   Social Determinants of Health   Financial Resource Strain:   . Difficulty of Paying Living Expenses: Not on file  Food Insecurity:   . Worried About Charity fundraiser in the Last Year: Not on file  . Ran Out of Food in the Last Year: Not on file  Transportation Needs:   . Lack of Transportation (Medical): Not on file  . Lack of Transportation (Non-Medical): Not on file  Physical Activity:   . Days of Exercise per Week:  Not on file  . Minutes of Exercise per Session: Not on file  Stress:   . Feeling of Stress : Not on file  Social Connections:   . Frequency of Communication with Friends and Family: Not on file  . Frequency of Social Gatherings with Friends and Family: Not on file  . Attends Religious Services: Not on file  . Active Member of Clubs or Organizations: Not on file  . Attends Archivist Meetings: Not on file  . Marital Status: Not on file  Intimate Partner Violence:   . Fear of Current or Ex-Partner: Not on file  . Emotionally Abused: Not on file  . Physically Abused: Not on file  . Sexually Abused: Not on file      Current Outpatient Medications:  .  fluticasone (FLONASE) 50 MCG/ACT nasal spray, TAKE 2 PUFFS INTO EACH NOSTRIL DAILY, Disp: 16 g, Rfl: 6 .  Omega-3 1000 MG CAPS, Take by mouth daily. , Disp: , Rfl:  .  Probiotic Product (PROBIOTIC DAILY PO), Take by mouth daily., Disp: , Rfl:  .  sertraline (ZOLOFT) 100 MG tablet, TAKE 1.5 TABLET BY MOUTH EVERY DAY, Disp: 108 tablet, Rfl: 0  Allergies  Allergen Reactions  . Nitrofurantoin   . Penicillins   . Sulfamethoxazole-Trimethoprim     I personally reviewed active problem list, medication list, allergies, health maintenance, notes from last encounter with the patient/caregiver today.   ROS Constitutional: Negative for fever or weight change.  Respiratory: Negative for cough and shortness of breath.   Cardiovascular: Negative for chest pain or palpitations.  Gastrointestinal: Negative for abdominal pain, no bowel changes.  Musculoskeletal: Negative for gait problem or joint swelling.  Skin: Negative for rash.  Neurological: Negative for dizziness or headache.  No other specific complaints in a complete review of systems (except as listed in HPI above).   Objective  Virtual encounter, vitals not obtained.  There is no height or weight on file to calculate BMI.  Physical Exam  Pulmonary/Chest: Effort normal. No respiratory distress. Speaking in complete sentences Neurological: Pt is alert and oriented to person, place, and time. Speech is normal Psychiatric: Patient has a normal mood and affect. behavior is normal. Judgment and thought content normal.  No results found for this or any previous visit (from the past 72 hour(s)).  PHQ2/9: Depression screen [facility] 2/9 03/02/2019 12/09/2017 11/26/2017 11/26/2017 04/30/2017  Decreased Interest 0 0 0 0 0  Down, Depressed, Hopeless 0 0 0 0 0  PHQ - 2 Score 0 0 0 0 0  Altered sleeping 0 0 3 - -  Tired, decreased energy 0 0 3 - -  Change in appetite 0 0 0 - -  Feeling bad or failure about  yourself  0 0 0 - -  Trouble concentrating 0 0 0 - -  Moving slowly or fidgety/restless 0 0 0 - -  Suicidal thoughts 0 0 0 - -  PHQ-9 Score 0 0 6 - -  Difficult doing work/chores Not difficult at all - - - -   PHQ-2/9 Result is negative.    Fall Risk: Fall Risk  03/02/2019 12/09/2017 11/26/2017 04/30/2017  Falls in the past year? 0 No No No  Number falls in past yr: 0 - - -  Injury with Fall? 0 - - -  Follow up Falls evaluation completed - - -    Assessment & Plan  1. Adjustment disorder with anxious mood - Stable, wants to remain on 100mg  once daily.  -  sertraline (ZOLOFT) 100 MG tablet; TAKE 1 TABLET BY MOUTH EVERY DAY  Dispense: 90 tablet; Refill: 3  2. Allergic rhinitis, unspecified seasonality, unspecified trigger - Doing well on flonase monotherapy.   I discussed the assessment and treatment plan with the patient. The patient was provided an opportunity to ask questions and all were answered. The patient agreed with the plan and demonstrated an understanding of the instructions.   The patient was advised to call back or seek an in-person evaluation if the symptoms worsen or if the condition fails to improve as anticipated.  I provided 10 minutes of non-face-to-face time during this encounter.  Hubbard Hartshorn, FNP

## 2019-03-07 ENCOUNTER — Ambulatory Visit: Payer: BC Managed Care – PPO | Admitting: Family Medicine

## 2019-06-22 DIAGNOSIS — L700 Acne vulgaris: Secondary | ICD-10-CM | POA: Diagnosis not present

## 2019-06-23 ENCOUNTER — Encounter: Payer: Self-pay | Admitting: Family Medicine

## 2019-06-23 ENCOUNTER — Ambulatory Visit (INDEPENDENT_AMBULATORY_CARE_PROVIDER_SITE_OTHER): Payer: BC Managed Care – PPO | Admitting: Family Medicine

## 2019-06-23 ENCOUNTER — Other Ambulatory Visit: Payer: Self-pay

## 2019-06-23 VITALS — HR 74 | Temp 98.5°F | Resp 14 | Ht 70.0 in | Wt 164.0 lb

## 2019-06-23 DIAGNOSIS — Z0000 Encounter for general adult medical examination without abnormal findings: Secondary | ICD-10-CM

## 2019-06-23 NOTE — Progress Notes (Signed)
Patient: Brittany Savage, Female    DOB: November 03, 1970, 49 y.o.   MRN: [id number] Delsa Grana, PA-C Visit Date: 06/23/2019  Today's Provider: Delsa Grana, PA-C   Chief Complaint  Patient presents with  . Annual Exam    see's gyn   Subjective:   Annual physical exam:  Brittany Savage is a 49 y.o. female who presents today for complete physical exam:  Exercise/Activity:  Eats healthy Diet/nutrition:  Yoga and was doing boot camp, rides horses Sleep:  She gets good sleep  No labs for over a year, recent visit with other provider for med refill, reviewed today: Anxiety:  She has been taking 167m Zoloft for about 8 years and feels good on medication.  She denies panic attacks, crying spells, sleeping well without issues.  She would like to maintain her current medication dosing at this time.    Allergic Rhinitis: Using flonase no PO medications.  No concerns, no rhinorrhea, coughing.  - Colonoscopy - was canceled in March 2020 due to COVID-19 pandemic - she is reminded to call to reschedule when able. - Having Paps with GYN, [place] with [place]- Mammograms through GYN - needs to sign records release, however she states these are UTD.  USPSTF grade A and B recommendations - reviewed and addressed today  Depression:  Phq 9 completed today by patient, was reviewed by me with patient in the room PHQ score is neg, feels good PHQ 2/9 Scores 06/23/2019 03/02/2019 12/09/2017 11/26/2017  PHQ - 2 Score 0 0 0 0  PHQ- 9 Score 0 0 0 6   [facility] 06/23/2019 03/02/2019 12/09/2017 11/26/2017 11/26/2017  Decreased Interest 0 0 0 0 0  Down, Depressed, Hopeless 0 0 0 0 0  PHQ - 2 Score 0 0 0 0 0  Altered sleeping 0 0 0 3 -  Tired, decreased energy 0 0 0 3 -  Change in appetite 0 0 0 0 -  Feeling bad or failure about yourself  0 0 0 0 -  Trouble concentrating 0 0 0 0 -  Moving slowly or fidgety/restless 0 0 0 0 -  Suicidal thoughts 0 0 0 0 -  PHQ-9 Score 0 0 0 6 -   Difficult doing work/chores Not difficult at all Not difficult at all - - -    Alcohol screening:   [facility]  4      Immunizations and Health Maintenance: Health Maintenance  Topic Date Due  . HIV Screening  06/22/2020 (Originally 01/23/1984)  . INFLUENZA VACCINE  10/09/2019  . COLONOSCOPY  04/29/2020  . PAP SMEAR-Modifier  08/13/2020  . MAMMOGRAM  10/08/2020  . TETANUS/TDAP  05/08/2029     Hep C Screening: n/a   STD testing and prevention (HIV/chl/gon/syphilis):  see above, no additional testing desired by pt today - none needed  Intimate partner violence:   Feels safe  Sexual History/Pain during Intercourse:   Married, none   Menstrual History/LMP/Abnormal Bleeding: no concerns No LMP recorded. Patient has had an ablation.  Incontinence Symptoms:  none Breast cancer:  Last Mammogram: see HM list above - does with [facility] - does pap and mammo BRCA gene screening:  None done in the past  Cervical cancer screening: utd  Pt has family hx of cancers - breast, ovarian, uterine, colon:   Mom with breast cancer  Colon cancer in grandmother  Across the street for colonoscopy in  the past  Osteoporosis:   Discussion on osteoporosis per age, including high calcium and vitamin D supplementation, weight bearing exercises Pt is not supplementing with daily calcium/Vit D.   Skin cancer:  Hx of skin CA -  NO Discussed atypical lesions   Colorectal cancer:   Colonoscopy is due   Discussed concerning signs and sx of CRC, pt denies melena, hematochezia, change in BM pattern/caliber  Lung cancer:   Low Dose CT Chest recommended if Age 11-80 years, 30 pack-year currently smoking OR have quit w/in 15years. Patient does not qualify.    Social History   Tobacco Use  . Smoking status: Never Smoker  . Smokeless tobacco: Never Used  Substance Use Topics  . Alcohol use: Yes    Alcohol/week: 21.0 standard  drinks    Types: 21 Glasses of wine per week  . Drug use: No       [facility] Visit from 06/23/2019 in [facility]  AUDIT-C Score  4      Family History  Problem Relation Age of Onset  . Breast cancer Mother   . Heart disease Father   . Colon cancer Maternal Grandfather      Blood pressure/Hypertension: BP Readings from Last 3 Encounters:  12/09/17 120/70  11/26/17 116/78  11/04/17 110/74    Weight/Obesity: Wt Readings from Last 3 Encounters:  06/23/19 164 lb (74.4 kg)  12/09/17 168 lb (76.2 kg)  11/26/17 168 lb 1.6 oz (76.2 kg)   BMI Readings from Last 3 Encounters:  06/23/19 23.53 kg/m  12/09/17 24.11 kg/m  11/26/17 24.12 kg/m     Lipids:  Lab Results  Component Value Date   CHOL 158 11/26/2017   CHOL 187 05/22/2016   Lab Results  Component Value Date   HDL 66 11/26/2017   HDL 72 05/22/2016   Lab Results  Component Value Date   LDLCALC 76 11/26/2017   LDLCALC 93 05/22/2016   Lab Results  Component Value Date   TRIG 77 11/26/2017   TRIG 109 05/22/2016   Lab Results  Component Value Date   CHOLHDL 2.4 11/26/2017   CHOLHDL 2.6 05/22/2016   No results found for: LDLDIRECT Based on the results of lipid panel his/her cardiovascular risk factor ( using Lucas )  in the next 10 years is: The 10-year ASCVD risk score Mikey Bussing DC Brooke Bonito., et al., 2013) is: 1.1%   Values used to calculate the score:     Age: 49 years     Sex: Female     Is Non-Hispanic African American: No     Diabetic: No     Tobacco smoker: No     Systolic Blood Pressure: 480 mmHg     Is BP treated: No     HDL Cholesterol: 66 mg/dL     Total Cholesterol: 158 mg/dL Glucose:  Glucose  Date Value Ref Range Status  05/22/2016 84 65 - 99 mg/dL Final   Glucose, Bld  Date Value Ref Range Status  11/26/2017 88 65 - 139 mg/dL Final    Comment:    .        Non-fasting reference interval .    Hypertension: BP Readings from Last 3 Encounters:  12/09/17 120/70   11/26/17 116/78  11/04/17 110/74   Obesity: Wt Readings from Last 3 Encounters:  06/23/19 164 lb (74.4 kg)  12/09/17 168 lb (76.2 kg)  11/26/17 168 lb 1.6 oz (76.2 kg)   BMI Readings from Last 3 Encounters:  06/23/19 23.53  kg/m  12/09/17 24.11 kg/m  11/26/17 24.12 kg/m      Advanced Care Planning:  A voluntary discussion about advance care planning including the explanation and discussion of advance directives.   Discussed health care proxy and Living will, and the patient was able to identify a health care proxy asspouse Jeneen Rinks Patient does not have a living will at present time.   Social History      She        Social History   Socioeconomic History  . Marital status: Married    Spouse name: Not on file  . Number of children: Not on file  . Years of education: Not on file  . Highest education level: Not on file  Occupational History  . Not on file  Tobacco Use  . Smoking status: Never Smoker  . Smokeless tobacco: Never Used  Substance and Sexual Activity  . Alcohol use: Yes    Alcohol/week: 21.0 standard drinks    Types: 21 Glasses of wine per week  . Drug use: No  . Sexual activity: Yes    Birth control/protection: None  Other Topics Concern  . Not on file  Social History Narrative  . Not on file   Social Determinants of Health   Financial Resource Strain:   . Difficulty of Paying Living Expenses:   Food Insecurity:   . Worried About Charity fundraiser in the Last Year:   . Arboriculturist in the Last Year:   Transportation Needs:   . Film/video editor (Medical):   Marland Kitchen Lack of Transportation (Non-Medical):   Physical Activity:   . Days of Exercise per Week:   . Minutes of Exercise per Session:   Stress:   . Feeling of Stress :   Social Connections:   . Frequency of Communication with Friends and Family:   . Frequency of Social Gatherings with Friends and Family:   . Attends Religious Services:   . Active Member of Clubs or Organizations:    . Attends Archivist Meetings:   Marland Kitchen Marital Status:     Family History        Family History  Problem Relation Age of Onset  . Breast cancer Mother   . Heart disease Father   . Colon cancer Maternal Grandfather     Patient Active Problem List   Diagnosis Date Noted  . Colon cancer screening 05/13/2018  . Abnormal blood sugar 04/30/2017  . 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 Bite 04/30/2017  . Burning or prickling sensation 04/30/2017  . Alcohol consumption of more than two drinks per day 04/30/2017  . Allergic rhinitis 10/09/2014  . 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 Atypical chest pain 05/11/2014  . Palpitations 05/11/2014  . KNEE PAIN, LEFT 01/29/2007  . METATARSALGIA 01/29/2007  . Pain in limb 01/29/2007    Past Surgical History:  Procedure Laterality Date  . DILATION AND CURETTAGE OF UTERUS  2006  . NASAL SINUS SURGERY  2011     Current Outpatient Medications:  .  fluticasone (FLONASE) 50 MCG/ACT nasal spray, TAKE 2 PUFFS INTO EACH NOSTRIL DAILY, Disp: 16 g, Rfl: 6 .  minocycline (MINOCIN) 100 MG capsule, Take 100 mg by mouth 2 (two) times daily., Disp: , Rfl:  .  Multiple Vitamin (MULTIVITAMIN) tablet, Take 1 tablet by mouth daily., Disp: , Rfl:  .  Omega-3 1000 MG CAPS, Take by mouth daily. , Disp: , Rfl:  .  Probiotic Product (PROBIOTIC DAILY PO), Take by  mouth daily., Disp: , Rfl:  .  sertraline (ZOLOFT) 100 MG tablet, TAKE 1 TABLET BY MOUTH EVERY DAY, Disp: 90 tablet, Rfl: 3  Allergies  Allergen Reactions  . Nitrofurantoin   . Penicillins   . Sulfamethoxazole-Trimethoprim     Patient Care Team: Delsa Grana, PA-C as PCP - General (Family Medicine)  Review of Systems  Constitutional: Negative.  Negative for activity change, appetite change, fatigue and unexpected weight change.  HENT: Negative.   Eyes: Negative.   Respiratory: Negative.  Negative for shortness of breath.   Cardiovascular: Negative.  Negative for chest pain, palpitations and leg  swelling.  Gastrointestinal: Negative.  Negative for abdominal pain and blood in stool.  Endocrine: Negative.   Genitourinary: Negative.   Musculoskeletal: Negative.  Negative for arthralgias, gait problem, joint swelling and myalgias.  Skin: Negative.  Negative for color change, pallor and rash.  Allergic/Immunologic: Negative.   Neurological: Negative.  Negative for syncope and weakness.  Hematological: Negative.   Psychiatric/Behavioral: Negative.  Negative for confusion, dysphoric mood, self-injury and suicidal ideas. The patient is not nervous/anxious.      I personally reviewed active problem list, medication list, allergies, family history, social history, health maintenance, notes from last encounter, lab results, imaging with the patient/caregiver today.        Objective:   Vitals:  Vitals:   06/23/19 0913  Pulse: 74  Resp: 14  Temp: 98.5 F (36.9 C)  SpO2: 100%  Weight: 164 lb (74.4 kg)  Height: 5' 10"  (1.778 m)    Body mass index is 23.53 kg/m.  Physical Exam Vitals and nursing note reviewed.  Constitutional:      General: She is not in acute distress.    Appearance: Normal appearance. She is well-developed. She is not ill-appearing, toxic-appearing or diaphoretic.  HENT:     Head: Normocephalic and atraumatic.     Right Ear: External ear normal.     Left Ear: External ear normal.     Mouth/Throat:     Pharynx: Uvula midline.  Eyes:     General: Lids are normal.        Right eye: No discharge.        Left eye: No discharge.     Conjunctiva/sclera: Conjunctivae normal.     Pupils: Pupils are equal, round, and reactive to light.  Neck:     Thyroid: No thyroid mass, thyromegaly or thyroid tenderness.     Trachea: Trachea and phonation normal. No tracheal deviation.  Cardiovascular:     Rate and Rhythm: Normal rate and regular rhythm.     Pulses: Normal pulses.          Radial pulses are 2+ on the right side and 2+ on the left side.       Posterior  tibial pulses are 2+ on the right side and 2+ on the left side.     Heart sounds: Normal heart sounds. No murmur. No friction rub. No gallop.   Pulmonary:     Effort: Pulmonary effort is normal. No respiratory distress.     Breath sounds: Normal breath sounds. No stridor. No wheezing, rhonchi or rales.  Chest:     Chest wall: No tenderness.  Abdominal:     General: Bowel sounds are normal. There is no distension.     Palpations: Abdomen is soft.     Tenderness: There is no abdominal tenderness. There is no guarding or rebound.  Musculoskeletal:        General: No deformity.  Normal range of motion.     Cervical back: Normal range of motion and neck supple.  Lymphadenopathy:     Cervical: No cervical adenopathy.  Skin:    General: Skin is warm and dry.     Capillary Refill: Capillary refill takes less than 2 seconds.     Coloration: Skin is not pale.     Findings: No rash.  Neurological:     Mental Status: She is alert and oriented to person, place, and time.     Motor: No abnormal muscle tone.     Coordination: Coordination normal.     Gait: Gait normal.  Psychiatric:        Speech: Speech normal.        Behavior: Behavior normal.       Fall Risk: Fall Risk  06/23/2019 03/02/2019 12/09/2017 11/26/2017 04/30/2017  Falls in the past year? 0 0 No No No  Number falls in past yr: 0 0 - - -  Injury with Fall? 0 0 - - -  Follow up - Falls evaluation completed - - -    Functional Status Survey: Is the patient deaf or have difficulty hearing?: No Does the patient have difficulty seeing, even when wearing glasses/contacts?: No Does the patient have difficulty concentrating, remembering, or making decisions?: No Does the patient have difficulty walking or climbing stairs?: No Does the patient have difficulty dressing or bathing?: No Does the patient have difficulty doing errands alone such as visiting a doctor's office or shopping?: No   Assessment & Plan:    CPE completed today   . USPSTF grade A and B recommendations reviewed with patient; age-appropriate recommendations, preventive care, screening tests, etc discussed and encouraged; healthy living encouraged; see AVS for patient education given to patient  . Discussed importance of 150 minutes of physical activity weekly, AHA exercise recommendations given to pt in AVS/handout  . Discussed importance of healthy diet:  eating lean meats and proteins, avoiding trans fats and saturated fats, avoid simple sugars and excessive carbs in diet, eat 6 servings of fruit/vegetables daily and drink plenty of water and avoid sweet beverages.    . Recommended pt to do annual eye exam and routine dental exams/cleanings  . Depression, alcohol, fall screening completed as documented above and per flowsheets  . Reviewed Health Maintenance: Health Maintenance  Topic Date Due  . HIV Screening  06/22/2020 (Originally 01/23/1984)  . INFLUENZA VACCINE  10/09/2019  . MAMMOGRAM  10/09/2019  . COLONOSCOPY  04/29/2020  . PAP SMEAR-Modifier  08/13/2020  . TETANUS/TDAP  05/08/2029    . Immunizations: Immunization History  Administered Date(s) Administered  . Influenza Inj Mdck Quad With Preservative 01/03/2017  . Influenza Split 01/18/2004  . Influenza,inj,Quad PF,6+ Mos 11/26/2017  . Tdap 05/09/2019    1. Adult general medical exam - CBC with Differential/Platelet - COMPLETE METABOLIC PANEL WITH GFR - Lipid panel     Delsa Grana, PA-C 06/23/19 9:40 AM  [facility]

## 2019-06-23 NOTE — Patient Instructions (Addendum)
Health Maintenance  Topic Date Due  . HIV Screening  06/22/2020 (Originally 01/23/1984)  . INFLUENZA VACCINE  10/09/2019  . MAMMOGRAM  10/09/2019  . COLONOSCOPY  04/29/2020  . PAP SMEAR-Modifier  08/13/2020  . TETANUS/TDAP  05/08/2029     Preventive Care 67-51 Years Old, Female Preventive care refers to visits with your health care provider and lifestyle choices that can promote health and wellness. This includes:  A yearly physical exam. This may also be called an annual well check.  Regular dental visits and eye exams.  Immunizations.  Screening for certain conditions.  Healthy lifestyle choices, such as eating a healthy diet, getting regular exercise, not using drugs or products that contain nicotine and tobacco, and limiting alcohol use. What can I expect for my preventive care visit? Physical exam Your health care provider will check your:  Height and weight. This may be used to calculate body mass index (BMI), which tells if you are at a healthy weight.  Heart rate and blood pressure.  Skin for abnormal spots. Counseling Your health care provider may ask you questions about your:  Alcohol, tobacco, and drug use.  Emotional well-being.  Home and relationship well-being.  Sexual activity.  Eating habits.  Work and work Statistician.  Method of birth control.  Menstrual cycle.  Pregnancy history. What immunizations do I need?  Influenza (flu) vaccine  This is recommended every year. Tetanus, diphtheria, and pertussis (Tdap) vaccine  You may need a Td booster every 10 years. Varicella (chickenpox) vaccine  You may need this if you have not been vaccinated. Zoster (shingles) vaccine  You may need this after age 42. Measles, mumps, and rubella (MMR) vaccine  You may need at least one dose of MMR if you were born in 1957 or later. You may also need a second dose. Pneumococcal conjugate (PCV13) vaccine  You may need this if you have certain  conditions and were not previously vaccinated. Pneumococcal polysaccharide (PPSV23) vaccine  You may need one or two doses if you smoke cigarettes or if you have certain conditions. Meningococcal conjugate (MenACWY) vaccine  You may need this if you have certain conditions. Hepatitis A vaccine  You may need this if you have certain conditions or if you travel or work in places where you may be exposed to hepatitis A. Hepatitis B vaccine  You may need this if you have certain conditions or if you travel or work in places where you may be exposed to hepatitis B. Haemophilus influenzae type b (Hib) vaccine  You may need this if you have certain conditions. Human papillomavirus (HPV) vaccine  If recommended by your health care provider, you may need three doses over 6 months. You may receive vaccines as individual doses or as more than one vaccine together in one shot (combination vaccines). Talk with your health care provider about the risks and benefits of combination vaccines. What tests do I need? Blood tests  Lipid and cholesterol levels. These may be checked every 5 years, or more frequently if you are over 61 years old.  Hepatitis C test.  Hepatitis B test. Screening  Lung cancer screening. You may have this screening every year starting at age 25 if you have a 30-pack-year history of smoking and currently smoke or have quit within the past 15 years.  Colorectal cancer screening. All adults should have this screening starting at age 38 and continuing until age 64. Your health care provider may recommend screening at age 25 if you are  at increased risk. You will have tests every 1-10 years, depending on your results and the type of screening test.  Diabetes screening. This is done by checking your blood sugar (glucose) after you have not eaten for a while (fasting). You may have this done every 1-3 years.  Mammogram. This may be done every 1-2 years. Talk with your health care  provider about when you should start having regular mammograms. This may depend on whether you have a family history of breast cancer.  BRCA-related cancer screening. This may be done if you have a family history of breast, ovarian, tubal, or peritoneal cancers.  Pelvic exam and Pap test. This may be done every 3 years starting at age 47. Starting at age 58, this may be done every 5 years if you have a Pap test in combination with an HPV test. Other tests  Sexually transmitted disease (STD) testing.  Bone density scan. This is done to screen for osteoporosis. You may have this scan if you are at high risk for osteoporosis. Follow these instructions at home: Eating and drinking  Eat a diet that includes fresh fruits and vegetables, whole grains, lean protein, and low-fat dairy.  Take vitamin and mineral supplements as recommended by your health care provider.  Do not drink alcohol if: ? Your health care provider tells you not to drink. ? You are pregnant, may be pregnant, or are planning to become pregnant.  If you drink alcohol: ? Limit how much you have to 0-1 drink a day. ? Be aware of how much alcohol is in your drink. In the [place] one drink equals one 12 oz bottle of beer (355 mL), one 5 oz glass of wine (148 mL), or one 1 oz glass of hard liquor (44 mL). Lifestyle  Take daily care of your teeth and gums.  Stay active. Exercise for at least 30 minutes on 5 or more days each week.  Do not use any products that contain nicotine or tobacco, such as cigarettes, e-cigarettes, and chewing tobacco. If you need help quitting, ask your health care provider.  If you are sexually active, practice safe sex. Use a condom or other form of birth control (contraception) in order to prevent pregnancy and STIs (sexually transmitted infections).  If told by your health care provider, take low-dose aspirin daily starting at age 51. What's next?  Visit your health care provider once a year for a  well check visit.  Ask your health care provider how often you should have your eyes and teeth checked.  Stay up to date on all vaccines. This information is not intended to replace advice given to you by your health care provider. Make sure you discuss any questions you have with your health care provider. Document Revised: 11/05/2017 Document Reviewed: 11/05/2017 Elsevier Patient Education  2020 [employer].

## 2019-06-24 ENCOUNTER — Other Ambulatory Visit: Payer: Self-pay | Admitting: Family Medicine

## 2019-06-24 ENCOUNTER — Other Ambulatory Visit
Admission: RE | Admit: 2019-06-24 | Discharge: 2019-06-24 | Disposition: A | Discharge status: Home / Self Care | Payer: BC Managed Care – PPO | Source: Ambulatory Visit | Attending: Family Medicine | Admitting: Family Medicine

## 2019-06-24 ENCOUNTER — Telehealth: Payer: Self-pay

## 2019-06-24 DIAGNOSIS — E875 Hyperkalemia: Secondary | ICD-10-CM | POA: Insufficient documentation

## 2019-06-24 LAB — LIPID PANEL
Cholesterol: 180 mg/dL (ref <200)
HDL: 63 mg/dL (ref >=50)
LDL Cholesterol (Calc): 98 mg/dL (calc)
Non-HDL Cholesterol (Calc): 117 mg/dL (calc) (ref <130)
Total CHOL/HDL Ratio: 2.9 (calc) (ref <5.0)
Triglycerides: 101 mg/dL (ref <150)

## 2019-06-24 LAB — CBC WITH DIFFERENTIAL/PLATELET
Absolute Monocytes: 707 cells/uL (ref 200–950)
Basophils Absolute: 37 cells/uL (ref 0–200)
Basophils Relative: 0.6 %
Eosinophils Absolute: 143 cells/uL (ref 15–500)
Eosinophils Relative: 2.3 %
HCT: 42.6 % (ref 35.0–45.0)
Hemoglobin: 13.7 g/dL (ref 11.7–15.5)
Lymphs Abs: 2102 cells/uL (ref 850–3900)
MCH: 31.6 pg (ref 27.0–33.0)
MCHC: 32.2 g/dL (ref 32.0–36.0)
MCV: 98.4 fL (ref 80.0–100.0)
MPV: 10.7 fL (ref 7.5–12.5)
Monocytes Relative: 11.4 %
Neutro Abs: 3212 cells/uL (ref 1500–7800)
Neutrophils Relative %: 51.8 %
Platelets: 261 10*3/uL (ref 140–400)
RBC: 4.33 10*6/uL (ref 3.80–5.10)
RDW: 12.4 % (ref 11.0–15.0)
Total Lymphocyte: 33.9 %
WBC: 6.2 10*3/uL (ref 3.8–10.8)

## 2019-06-24 LAB — COMPLETE METABOLIC PANEL WITH GFR
AG Ratio: 1.6 (calc) (ref 1.0–2.5)
ALT: 21 U/L (ref 6–29)
AST: 26 U/L (ref 10–35)
Albumin: 4.5 g/dL (ref 3.6–5.1)
Alkaline phosphatase (APISO): 52 U/L (ref 37–153)
BUN: 20 mg/dL (ref 7–25)
CO2: 27 mmol/L (ref 20–32)
Calcium: 9.9 mg/dL (ref 8.6–10.4)
Chloride: 105 mmol/L (ref 98–110)
Creat: 0.92 mg/dL (ref 0.50–1.05)
GFR, Est African American: 84 mL/min/{1.73_m2} (ref >=60)
GFR, Est Non African American: 73 mL/min/{1.73_m2} (ref >=60)
Globulin: 2.8 g/dL (calc) (ref 1.9–3.7)
Glucose, Bld: 102 mg/dL — ABNORMAL HIGH (ref 65–99)
Potassium: 6.2 mmol/L (ref 3.5–5.3)
Sodium: 141 mmol/L (ref 135–146)
Total Bilirubin: 0.4 mg/dL (ref 0.2–1.2)
Total Protein: 7.3 g/dL (ref 6.1–8.1)

## 2019-06-24 LAB — POTASSIUM: Potassium: 4.5 mmol/L (ref 3.5–5.1)

## 2019-06-24 NOTE — Telephone Encounter (Signed)
Called patient left voicemail to call back.  She had abnormal high potassium per provider she needs to go to hospital lab for repeat stat testing asap. Orders entered, will try to call again.

## 2019-07-04 ENCOUNTER — Encounter: Payer: Self-pay | Admitting: Family Medicine

## 2019-10-18 DIAGNOSIS — H1032 Unspecified acute conjunctivitis, left eye: Secondary | ICD-10-CM | POA: Diagnosis not present

## 2019-10-19 DIAGNOSIS — Z01419 Encounter for gynecological examination (general) (routine) without abnormal findings: Secondary | ICD-10-CM | POA: Diagnosis not present

## 2019-10-19 DIAGNOSIS — Z1231 Encounter for screening mammogram for malignant neoplasm of breast: Secondary | ICD-10-CM | POA: Diagnosis not present

## 2019-10-19 DIAGNOSIS — Z124 Encounter for screening for malignant neoplasm of cervix: Secondary | ICD-10-CM | POA: Diagnosis not present

## 2019-10-19 DIAGNOSIS — Z6823 Body mass index (BMI) 23.0-23.9, adult: Secondary | ICD-10-CM | POA: Diagnosis not present

## 2019-10-19 LAB — HM MAMMOGRAPHY

## 2019-11-18 ENCOUNTER — Other Ambulatory Visit: Payer: Self-pay

## 2019-11-18 DIAGNOSIS — J309 Allergic rhinitis, unspecified: Secondary | ICD-10-CM

## 2019-11-18 MED ORDER — FLUTICASONE PROPIONATE 50 MCG/ACT NA SUSP: 2.0000 | Freq: Every day | NASAL | 6 refills | Status: DC

## 2019-12-12 DIAGNOSIS — M25462 Effusion, left knee: Secondary | ICD-10-CM | POA: Diagnosis not present

## 2019-12-19 DIAGNOSIS — M25462 Effusion, left knee: Secondary | ICD-10-CM | POA: Diagnosis not present

## 2019-12-23 ENCOUNTER — Ambulatory Visit: Payer: BC Managed Care – PPO | Admitting: Family Medicine

## 2020-01-30 DIAGNOSIS — L309 Dermatitis, unspecified: Secondary | ICD-10-CM | POA: Diagnosis not present

## 2020-02-22 DIAGNOSIS — Z1152 Encounter for screening for COVID-19: Secondary | ICD-10-CM | POA: Diagnosis not present

## 2020-02-22 DIAGNOSIS — Z03818 Encounter for observation for suspected exposure to other biological agents ruled out: Secondary | ICD-10-CM | POA: Diagnosis not present

## 2020-03-15 DIAGNOSIS — S93401A Sprain of unspecified ligament of right ankle, initial encounter: Secondary | ICD-10-CM | POA: Diagnosis not present

## 2020-03-20 DIAGNOSIS — M25571 Pain in right ankle and joints of right foot: Secondary | ICD-10-CM | POA: Diagnosis not present

## 2020-03-22 DIAGNOSIS — M25571 Pain in right ankle and joints of right foot: Secondary | ICD-10-CM | POA: Diagnosis not present

## 2020-04-04 DIAGNOSIS — M25571 Pain in right ankle and joints of right foot: Secondary | ICD-10-CM | POA: Diagnosis not present

## 2020-04-06 ENCOUNTER — Telehealth: Payer: Self-pay

## 2020-04-06 NOTE — Telephone Encounter (Signed)
Pt needs appt for refill on depression med

## 2020-04-06 NOTE — Telephone Encounter (Signed)
APPT MADE FOR 04-11-2020

## 2020-04-10 DIAGNOSIS — M25571 Pain in right ankle and joints of right foot: Secondary | ICD-10-CM | POA: Diagnosis not present

## 2020-04-11 ENCOUNTER — Ambulatory Visit: Payer: BC Managed Care – PPO | Admitting: Family Medicine

## 2020-04-11 ENCOUNTER — Other Ambulatory Visit: Payer: Self-pay

## 2020-04-11 ENCOUNTER — Encounter: Payer: Self-pay | Admitting: Family Medicine

## 2020-04-11 VITALS — BP 120/72 | HR 62 | Temp 98.3°F | Resp 16 | Ht 70.0 in

## 2020-04-11 DIAGNOSIS — L570 Actinic keratosis: Secondary | ICD-10-CM | POA: Diagnosis not present

## 2020-04-11 DIAGNOSIS — F4322 Adjustment disorder with anxiety: Secondary | ICD-10-CM

## 2020-04-11 DIAGNOSIS — D2261 Melanocytic nevi of right upper limb, including shoulder: Secondary | ICD-10-CM | POA: Diagnosis not present

## 2020-04-11 DIAGNOSIS — Z760 Encounter for issue of repeat prescription: Secondary | ICD-10-CM

## 2020-04-11 DIAGNOSIS — D225 Melanocytic nevi of trunk: Secondary | ICD-10-CM | POA: Diagnosis not present

## 2020-04-11 DIAGNOSIS — D2271 Melanocytic nevi of right lower limb, including hip: Secondary | ICD-10-CM | POA: Diagnosis not present

## 2020-04-11 DIAGNOSIS — X32XXXA Exposure to sunlight, initial encounter: Secondary | ICD-10-CM | POA: Diagnosis not present

## 2020-04-11 DIAGNOSIS — D2262 Melanocytic nevi of left upper limb, including shoulder: Secondary | ICD-10-CM | POA: Diagnosis not present

## 2020-04-11 MED ORDER — MINOCYCLINE HCL 100 MG PO CAPS: 100.0000 mg | ORAL_CAPSULE | Freq: Two times a day (BID) | ORAL | Status: DC | PRN

## 2020-04-11 MED ORDER — SERTRALINE HCL 100 MG PO TABS: ORAL_TABLET | ORAL | 3 refills | Status: DC

## 2020-04-11 NOTE — Progress Notes (Signed)
Name: Brittany Savage   MRN: [id number]    DOB: 09/05/1970   Date:04/11/2020       Progress Note  Chief Complaint  Patient presents with  . Medication Refill    zoloft    Subjective:    Brittany Savage is a 50 y.o. female, presents to clinic for med refill and MDD/anxiety f/up  Depression screen [facility] 2/9 04/11/2020 06/23/2019 03/02/2019  Decreased Interest 0 0 0  Down, Depressed, Hopeless 0 0 0  PHQ - 2 Score 0 0 0  Altered sleeping 0 0 0  Tired, decreased energy 0 0 0  Change in appetite 0 0 0  Feeling bad or failure about yourself  0 0 0  Trouble concentrating 0 0 0  Moving slowly or fidgety/restless 0 0 0  Suicidal thoughts 0 0 0  PHQ-9 Score 0 0 0  Difficult doing work/chores Not difficult at all Not difficult at all Not difficult at all   GAD 7 : Generalized Anxiety Score 04/11/2020  Nervous, Anxious, on Edge 0  Control/stop worrying 0  Worry too much - different things 0  Trouble relaxing 0  Restless 0  Easily annoyed or irritable 0  Afraid - awful might happen 0  Total GAD 7 Score 0  Anxiety Difficulty Not difficult at all    Has been on zoloft 100 mg for over 8 years, no SE or concerns at this time, she feels moods and sx are well controlled, no SI, HI, AVH  PHQ9 and GAD7 reviewed and neg   Pt was asked to get appt for refill and she expresses being frustrated and upset by this because for many many years she has only ever needed to do one visit a year and last OV was in April 2021   Current Outpatient Medications:  .  fluticasone (FLONASE) 50 MCG/ACT nasal spray, Place 2 sprays into both nostrils daily., Disp: 16 g, Rfl: 6 .  minocycline (MINOCIN) 100 MG capsule, Take 100 mg by mouth 2 (two) times daily., Disp: , Rfl:  .  Multiple Vitamin (MULTIVITAMIN) tablet, Take 1 tablet by mouth daily., Disp: , Rfl:  .  Omega-3 1000 MG CAPS, Take by mouth daily. , Disp: , Rfl:  .  Probiotic Product (PROBIOTIC DAILY PO), Take by mouth daily., Disp: , Rfl:  .   sertraline (ZOLOFT) 100 MG tablet, TAKE 1 TABLET BY MOUTH EVERY DAY, Disp: 90 tablet, Rfl: 3 .  meloxicam (MOBIC) 15 MG tablet, meloxicam 15 mg tablet, Disp: , Rfl:   Patient Active Problem List   Diagnosis Date Noted  . Colon cancer screening 05/13/2018  . Abnormal blood sugar 04/30/2017  . 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 Bite 04/30/2017  . Burning or prickling sensation 04/30/2017  . Alcohol consumption of more than two drinks per day 04/30/2017  . Allergic rhinitis 10/09/2014  . 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 Atypical chest pain 05/11/2014  . Palpitations 05/11/2014  . KNEE PAIN, LEFT 01/29/2007  . METATARSALGIA 01/29/2007  . Pain in limb 01/29/2007    Past Surgical History:  Procedure Laterality Date  . DILATION AND CURETTAGE OF UTERUS  2006  . NASAL SINUS SURGERY  2011    Family History  Problem Relation Age of Onset  . Breast cancer Mother   . Heart disease Father   . Colon cancer Maternal Grandfather     Social History   Tobacco Use  . Smoking status: Never Smoker  . Smokeless tobacco: Never Used  Vaping Use  .  Vaping Use: Never used  Substance Use Topics  . Alcohol use: Yes    Alcohol/week: 21.0 standard drinks    Types: 21 Glasses of wine per week  . Drug use: No     Allergies  Allergen Reactions  . Nitrofurantoin   . Penicillins   . Sulfamethoxazole-Trimethoprim     Health Maintenance  Topic Date Due  . Hepatitis C Screening  Never done  . COVID-19 Vaccine (1) Never done  . MAMMOGRAM  10/09/2019  . HIV Screening  06/22/2020 (Originally 01/23/1984)  . COLONOSCOPY (Pts 45-45yrs Insurance coverage will need to be confirmed)  04/29/2020  . PAP SMEAR-Modifier  08/13/2020  . TETANUS/TDAP  05/08/2029  . INFLUENZA VACCINE  Completed    Chart Review Today: I personally reviewed active problem list, medication list, allergies, family history, social history, health maintenance, notes from last encounter, lab results, imaging with the  patient/caregiver today.   Review of Systems  Constitutional: Negative.   HENT: Negative.   Eyes: Negative.   Respiratory: Negative.   Cardiovascular: Negative.   Gastrointestinal: Negative.   Endocrine: Negative.   Genitourinary: Negative.   Musculoskeletal: Negative.   Skin: Negative.   Allergic/Immunologic: Negative.   Neurological: Negative.   Hematological: Negative.   Psychiatric/Behavioral: Negative.   All other systems reviewed and are negative.    Objective:   Vitals:   04/11/20 0845  BP: 120/72  Pulse: 62  Resp: 16  Temp: 98.3 F (36.8 C)  TempSrc: Oral  SpO2: 99%  Height: 5\' 10"  (1.778 m)    Body mass index is 23.53 kg/m.  Physical Exam Vitals and nursing note reviewed.  Constitutional:      General: She is not in acute distress.    Appearance: Normal appearance. She is well-developed and normal weight. She is not ill-appearing, toxic-appearing or diaphoretic.  HENT:     Head: Normocephalic and atraumatic.     Right Ear: External ear normal.     Left Ear: External ear normal.  Eyes:     General:        Right eye: No discharge.        Left eye: No discharge.     Conjunctiva/sclera: Conjunctivae normal.  Neck:     Trachea: No tracheal deviation.  Cardiovascular:     Rate and Rhythm: Normal rate.  Pulmonary:     Effort: Pulmonary effort is normal. No respiratory distress.     Breath sounds: No stridor.  Skin:    General: Skin is warm and dry.  Neurological:     Mental Status: She is alert.     Motor: No abnormal muscle tone.     Coordination: Coordination normal.  Psychiatric:        Attention and Perception: Attention normal.        Mood and Affect: Mood and affect and mood normal.        Speech: Speech normal.        Behavior: Behavior is cooperative.        Thought Content: Thought content normal.         Assessment & Plan:     ICD-10-CM   1. Adjustment disorder with anxious mood  F43.22 sertraline (ZOLOFT) 100 MG tablet   no  concerns with long term use of stable dosing with zoloft - refill entered - I did discuss with pt I only require 1 visit a year with stable meds/moods  2. Medication refill  Z76.0 sertraline (ZOLOFT) 100 MG tablet   She is  using mobic for knee pain and ankle pain, prn, no BRBPR/melena Minocycline used prn -per dermatology for rash/outbreaks to face - updated and reviewed on chart   Return in about 1 year (around 04/11/2021) for med refill .   Delsa Grana, PA-C 04/11/20 8:49 AM

## 2020-04-13 ENCOUNTER — Encounter: Payer: Self-pay | Admitting: Family Medicine

## 2020-04-16 DIAGNOSIS — M25571 Pain in right ankle and joints of right foot: Secondary | ICD-10-CM | POA: Diagnosis not present

## 2020-04-18 DIAGNOSIS — M25571 Pain in right ankle and joints of right foot: Secondary | ICD-10-CM | POA: Diagnosis not present

## 2020-06-14 IMAGING — US US EXTREM LOW VENOUS*L*
1 series · 13 of 24 positions shown · non-contrast
Comparison: None.

CLINICAL DATA: Left leg pain and swelling, bruising for 4 days



[Series 1: us extrem low venous*left* · 0.07mm/px · 13 of 49 slices shown]
[im 1/49]
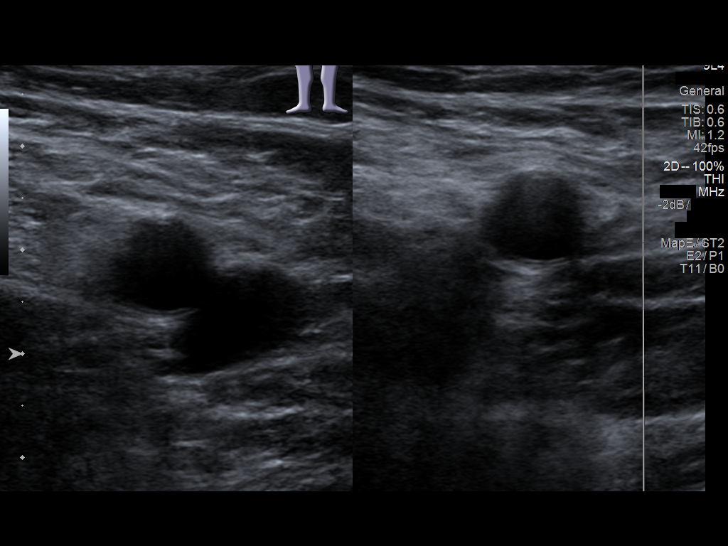
[im 5/49]
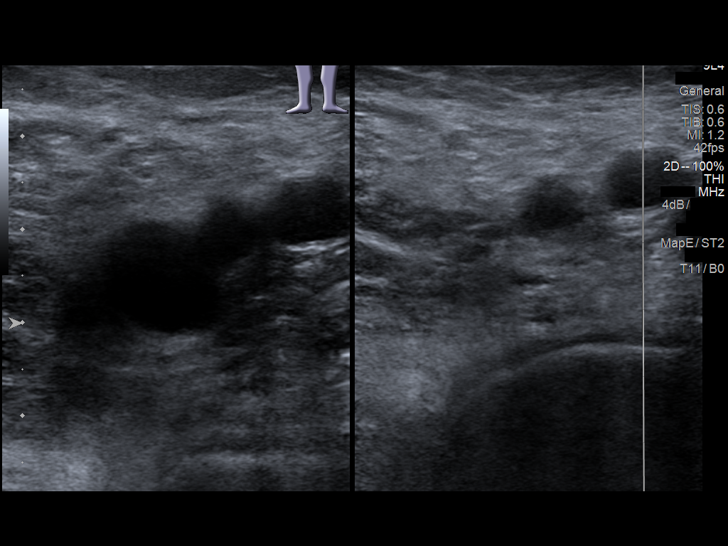
[im 9/49]
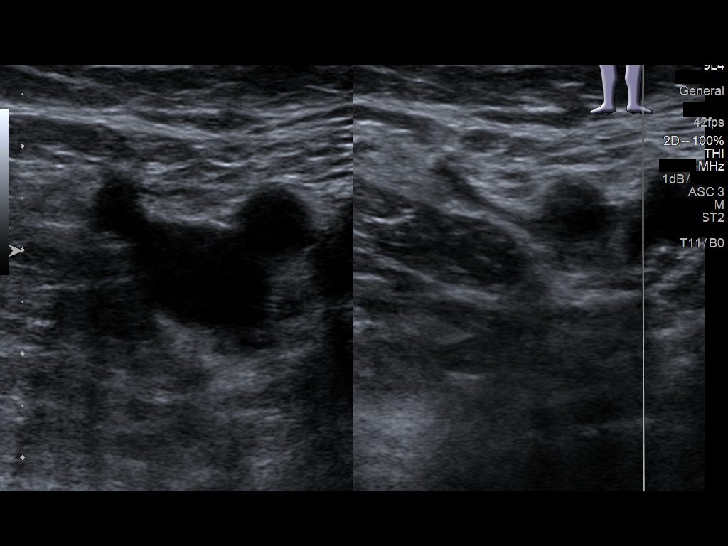
[im 13/49]
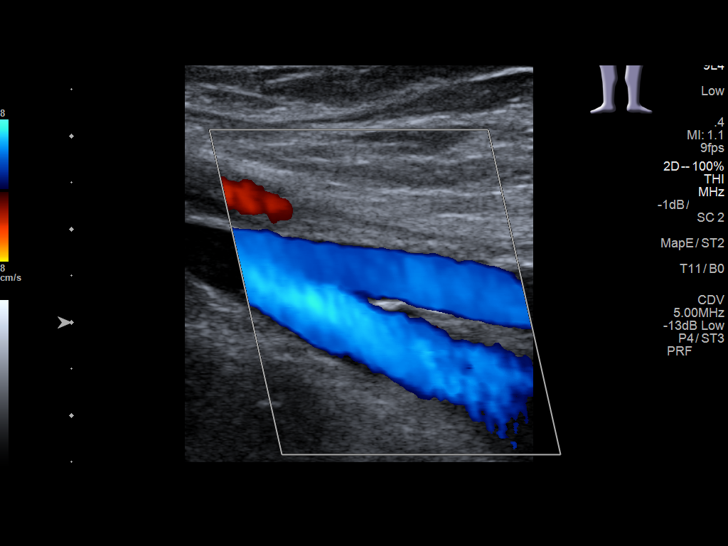
[im 17/49]
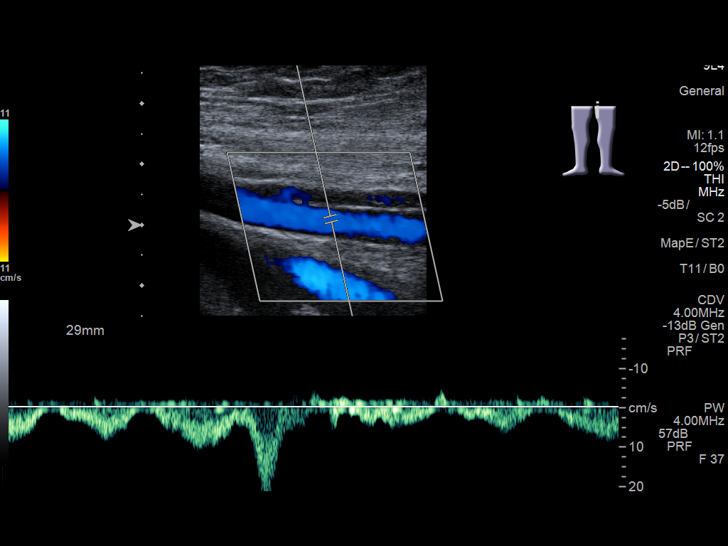
[im 21/49]
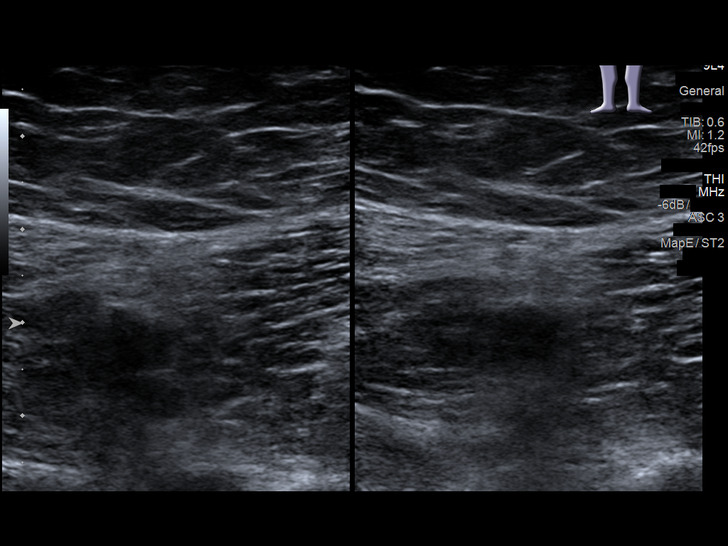
[im 26/49]
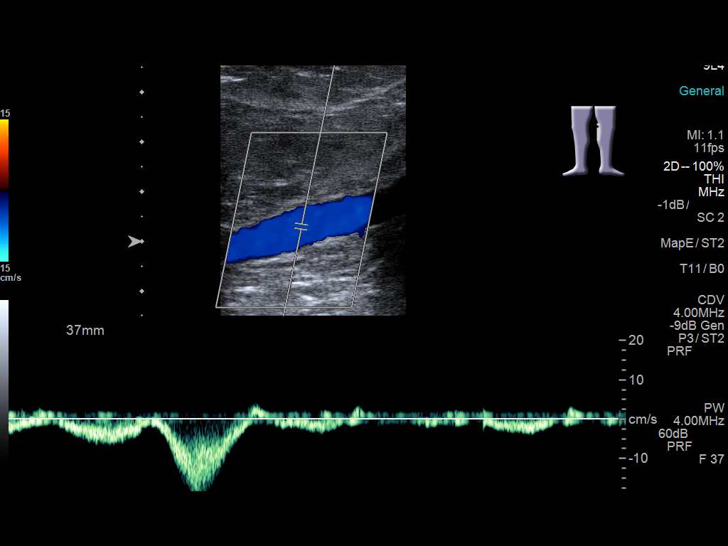
[im 28/49]
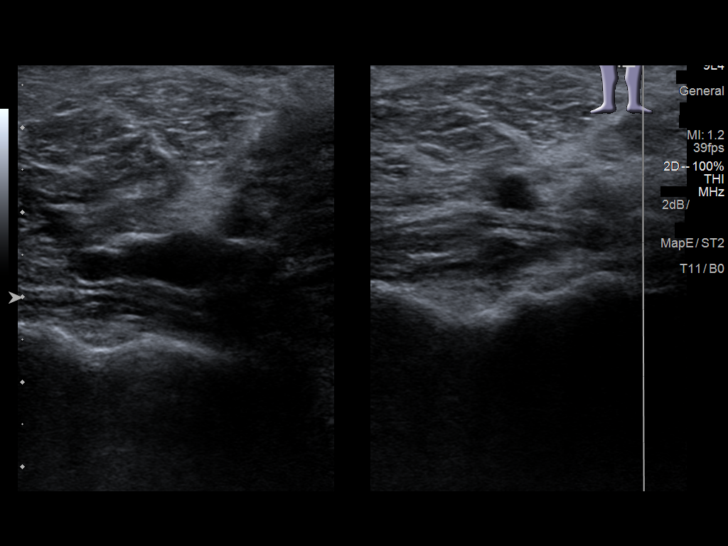
[im 32/49]
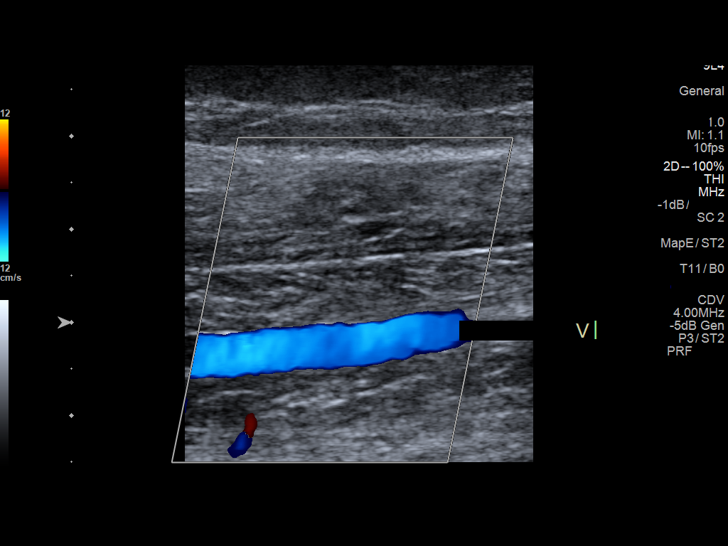
[im 36/49]
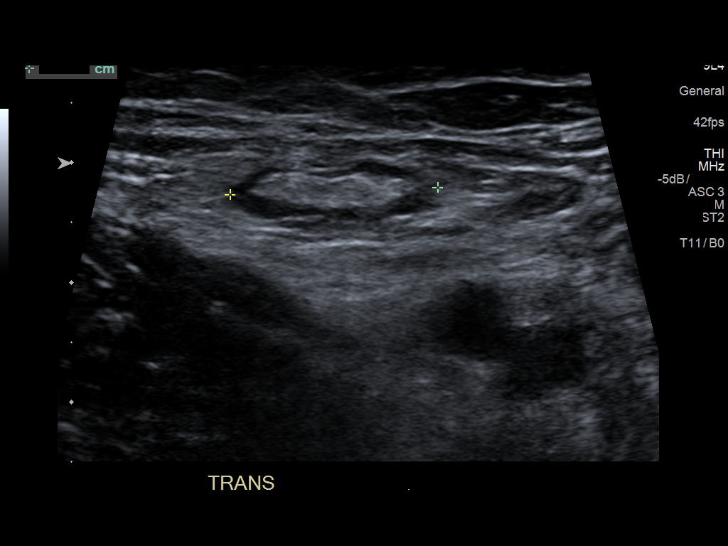
[im 40/49]
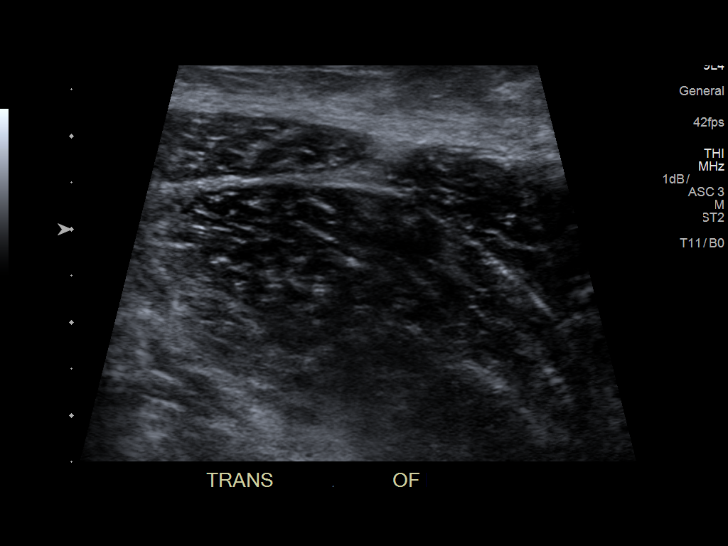
[im 44/49]
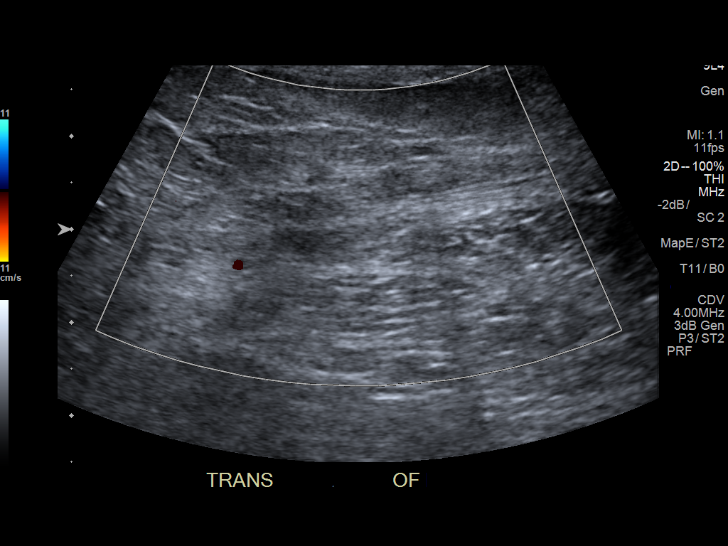
[im 49/49]
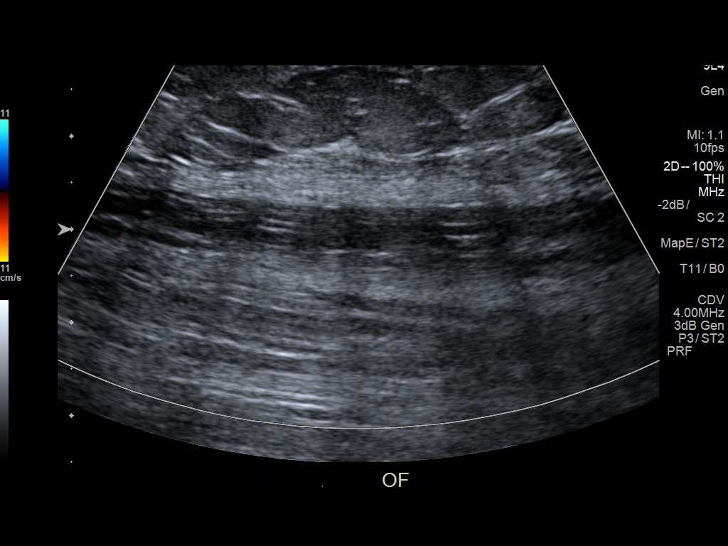

[13 of 24 positions shown; findings below may reference images not displayed]

FINDINGS: Contralateral Common Femoral Vein: Respiratory phasicity is normal
and symmetric with the symptomatic side. No evidence of thrombus.
Normal compressibility.

Common Femoral Vein: No evidence of thrombus. Normal
compressibility, respiratory phasicity and response to augmentation.

Saphenofemoral Junction: No evidence of thrombus. Normal
compressibility and flow on color Doppler imaging.

Profunda Femoral Vein: No evidence of thrombus. Normal
compressibility and flow on color Doppler imaging.

Femoral Vein: No evidence of thrombus. Normal compressibility,
respiratory phasicity and response to augmentation.

Popliteal Vein: No evidence of thrombus. Normal compressibility,
respiratory phasicity and response to augmentation.

Calf Veins: Limited assessment of the calf veins. Posterior tibial
vein not visualized. Peroneal vein appears patent. No significant
DVT appreciated.

Superficial Great Saphenous Vein: No evidence of thrombus. Normal
compressibility.

Venous Reflux:  None.

Other Findings:  Benign-appearing left inguinal lymph nodes noted
IMPRESSION: No significant left lower extremity DVT.

## 2020-06-19 DIAGNOSIS — Z03818 Encounter for observation for suspected exposure to other biological agents ruled out: Secondary | ICD-10-CM | POA: Diagnosis not present

## 2020-06-19 DIAGNOSIS — Z20822 Contact with and (suspected) exposure to covid-19: Secondary | ICD-10-CM | POA: Diagnosis not present

## 2020-09-19 ENCOUNTER — Other Ambulatory Visit: Payer: Self-pay

## 2020-09-19 ENCOUNTER — Ambulatory Visit: Payer: BC Managed Care – PPO | Admitting: Family Medicine

## 2020-09-19 VITALS — BP 122/82 | Ht 70.0 in | Wt 169.6 lb

## 2020-09-19 DIAGNOSIS — M76892 Other specified enthesopathies of left lower limb, excluding foot: Secondary | ICD-10-CM

## 2020-09-19 DIAGNOSIS — S76012A Strain of muscle, fascia and tendon of left hip, initial encounter: Secondary | ICD-10-CM | POA: Diagnosis not present

## 2020-09-19 DIAGNOSIS — S76312A Strain of muscle, fascia and tendon of the posterior muscle group at thigh level, left thigh, initial encounter: Secondary | ICD-10-CM | POA: Diagnosis not present

## 2020-09-19 NOTE — Progress Notes (Signed)
   I, Peterson Lombard, LAT, ATC acting as a scribe for Lynne Leader, MD.  Subjective:    CC: L proximal hamstring/buttock pain  HPI: Pt is a 50 y/o female c/o proximal hamstring/buttock pain x 8-9 months w/ no known MOI. Pt recalls doing a lot of yoga at the time, but doesn't recall a specific mechanism. Pt locates pain to L buttock and into L lateral hip.  Radiates: no LE Numbness/tingling: no LE Weakness: weaker than typical Aggravates: knee flexion, being active, walking, hip flexion Treatments tried: IBU, stretching  Dx testing: 12/09/17 L LE venous doppler US  Pertinent review of Systems: No fevers or chills  Relevant historical information: Healthy and active.  History of adaptive colitis.   Objective:    Vitals:   09/19/20 1512  BP: 122/82   General: Well Developed, well nourished, and in no acute distress.   MSK: Left hip normal-appearing Normal motion. Tender palpation greater trochanter and near ischial tuberosity posterior hip. Hip abduction strength is diminished and painful.  External rotation strength is intact. Hip extension strength is diminished and painful. Resisted knee flexion is intact but somewhat painful. Normal gait. Leg lengths are equal.      Impression and Recommendations:    Assessment and Plan: 50 y.o. female with posterior lateral hip pain.  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  She is an excellent candidate for physical therapy.  Plan to refer to PT and reassess in about 6 weeks.  If not improved would consider x-ray and MRI to further characterize cause of pain..  She lives in [place] and already has established relationship with Nicole Kindred PT.  PDMP not reviewed this encounter. Orders Placed This Encounter  Procedures   Ambulatory referral to Physical Therapy    Referral Priority:   Routine    Referral Type:   Physical  Medicine    Referral Reason:   Specialty Services Required    Requested Specialty:   Physical Therapy    Number of Visits Requested:   1   No orders of the defined types were placed in this encounter.   Discussed warning signs or symptoms. Please see discharge instructions. Patient expresses understanding.   The above documentation has been reviewed and is accurate and complete Lynne Leader, M.D.

## 2020-09-19 NOTE — Patient Instructions (Addendum)
Thank you for coming in today.   I've referred you to Physical Therapy.  Let us know if you don't hear from them in one week.   If not improving with PT next step should be Xray and MRI.   Recheck in about 6 weeks.   Let me know if you have a problem.

## 2020-09-26 DIAGNOSIS — M25552 Pain in left hip: Secondary | ICD-10-CM | POA: Diagnosis not present

## 2020-10-01 DIAGNOSIS — M25552 Pain in left hip: Secondary | ICD-10-CM | POA: Diagnosis not present

## 2020-10-02 DIAGNOSIS — M25552 Pain in left hip: Secondary | ICD-10-CM | POA: Diagnosis not present

## 2020-10-08 DIAGNOSIS — M25552 Pain in left hip: Secondary | ICD-10-CM | POA: Diagnosis not present

## 2020-10-10 DIAGNOSIS — M25552 Pain in left hip: Secondary | ICD-10-CM | POA: Diagnosis not present

## 2020-10-16 DIAGNOSIS — M25552 Pain in left hip: Secondary | ICD-10-CM | POA: Diagnosis not present

## 2020-10-18 DIAGNOSIS — M25552 Pain in left hip: Secondary | ICD-10-CM | POA: Diagnosis not present

## 2020-10-23 DIAGNOSIS — M25552 Pain in left hip: Secondary | ICD-10-CM | POA: Diagnosis not present

## 2020-10-25 DIAGNOSIS — M25552 Pain in left hip: Secondary | ICD-10-CM | POA: Diagnosis not present

## 2020-10-31 ENCOUNTER — Ambulatory Visit: Payer: BC Managed Care – PPO | Admitting: Family Medicine

## 2020-12-24 DIAGNOSIS — L2084 Intrinsic (allergic) eczema: Secondary | ICD-10-CM | POA: Diagnosis not present

## 2021-01-02 ENCOUNTER — Other Ambulatory Visit: Payer: Self-pay | Admitting: Family Medicine

## 2021-01-02 DIAGNOSIS — J309 Allergic rhinitis, unspecified: Secondary | ICD-10-CM

## 2021-02-18 ENCOUNTER — Other Ambulatory Visit: Payer: Self-pay | Admitting: Obstetrics and Gynecology

## 2021-02-18 DIAGNOSIS — R928 Other abnormal and inconclusive findings on diagnostic imaging of breast: Secondary | ICD-10-CM

## 2021-02-21 ENCOUNTER — Telehealth: Payer: Self-pay

## 2021-02-21 NOTE — Telephone Encounter (Signed)
CALLED PATIENT NO ANSWER LEFT VOICEMAIL FOR A CALL BACK ? ?

## 2021-02-22 ENCOUNTER — Other Ambulatory Visit: Payer: Self-pay | Admitting: Obstetrics and Gynecology

## 2021-02-22 ENCOUNTER — Ambulatory Visit
Admission: RE | Admit: 2021-02-22 | Discharge: 2021-02-22 | Disposition: A | Discharge status: Home / Self Care | Payer: PRIVATE HEALTH INSURANCE | Source: Ambulatory Visit | Attending: Obstetrics and Gynecology | Admitting: Obstetrics and Gynecology

## 2021-02-22 ENCOUNTER — Ambulatory Visit
Admission: RE | Admit: 2021-02-22 | Discharge: 2021-02-22 | Disposition: A | Discharge status: Home / Self Care | Payer: Self-pay | Source: Ambulatory Visit | Attending: Obstetrics and Gynecology | Admitting: Obstetrics and Gynecology

## 2021-02-22 DIAGNOSIS — R928 Other abnormal and inconclusive findings on diagnostic imaging of breast: Secondary | ICD-10-CM

## 2021-02-27 ENCOUNTER — Other Ambulatory Visit: Payer: Self-pay

## 2021-02-27 DIAGNOSIS — Z1211 Encounter for screening for malignant neoplasm of colon: Secondary | ICD-10-CM

## 2021-02-27 MED ORDER — CLENPIQ 10-3.5-12 MG-GM -GM/160ML PO SOLN: 1.0000 | Freq: Once | ORAL | 0 refills | Status: AC

## 2021-02-27 NOTE — Progress Notes (Signed)
Gastroenterology Pre-Procedure Review  Request Date: 03/07/21 Requesting Physician: Dr. Vicente Males  PATIENT REVIEW QUESTIONS: The patient responded to the following health history questions as indicated:    1. Are you having any GI issues? no 2. Do you have a personal history of Polyps?  02/20/212 No polyps removed. 3. Do you have a family history of Colon Cancer or Polyps? no 4. Diabetes Mellitus? no 5. Joint replacements in the past 12 months?no 6. Major health problems in the past 3 months?no 7. Any artificial heart valves, MVP, or defibrillator?no    MEDICATIONS & ALLERGIES:    Patient reports the following regarding taking any anticoagulation/antiplatelet therapy:   Plavix, Coumadin, Eliquis, Xarelto, Lovenox, Pradaxa, Brilinta, or Effient? no Aspirin? no  Patient confirms/reports the following medications:  Current Outpatient Medications  Medication Sig Dispense Refill   fluticasone (FLONASE) 50 MCG/ACT nasal spray Place 2 sprays into both nostrils daily. 16 g 6   meloxicam (MOBIC) 15 MG tablet meloxicam 15 mg tablet (Patient not taking: Reported on 09/19/2020)     minocycline (MINOCIN) 100 MG capsule Take 1 capsule (100 mg total) by mouth 2 (two) times daily as needed.     Multiple Vitamin (MULTIVITAMIN) tablet Take 1 tablet by mouth daily.     Omega-3 1000 MG CAPS Take by mouth daily.      Probiotic Product (PROBIOTIC DAILY PO) Take by mouth daily.     sertraline (ZOLOFT) 100 MG tablet TAKE 1 TABLET BY MOUTH EVERY DAY 90 tablet 3   No current facility-administered medications for this visit.    Patient confirms/reports the following allergies:  Allergies  Allergen Reactions   Nitrofurantoin    Penicillins    Sulfamethoxazole-Trimethoprim     No orders of the defined types were placed in this encounter.   AUTHORIZATION INFORMATION Primary Insurance: 1D#: Group #:  Secondary Insurance: 1D#: Group #:  SCHEDULE INFORMATION: Date: 03/07/21 Time: Location: [facility]

## 2021-03-06 ENCOUNTER — Encounter: Payer: Self-pay | Admitting: Gastroenterology

## 2021-03-07 ENCOUNTER — Ambulatory Visit
Admission: RE | Admit: 2021-03-07 | Discharge: 2021-03-07 | Disposition: A | Discharge status: Home / Self Care | Payer: PRIVATE HEALTH INSURANCE | Attending: Gastroenterology | Admitting: Gastroenterology

## 2021-03-07 ENCOUNTER — Ambulatory Visit: Payer: PRIVATE HEALTH INSURANCE | Admitting: Anesthesiology

## 2021-03-07 ENCOUNTER — Encounter
Admission: RE | Disposition: A | Discharge status: Home / Self Care | Payer: Self-pay | Source: Home / Self Care | Attending: Gastroenterology

## 2021-03-07 ENCOUNTER — Other Ambulatory Visit: Payer: Self-pay

## 2021-03-07 ENCOUNTER — Encounter: Payer: Self-pay | Admitting: Gastroenterology

## 2021-03-07 DIAGNOSIS — Z1211 Encounter for screening for malignant neoplasm of colon: Secondary | ICD-10-CM | POA: Insufficient documentation

## 2021-03-07 DIAGNOSIS — D123 Benign neoplasm of transverse colon: Secondary | ICD-10-CM | POA: Insufficient documentation

## 2021-03-07 DIAGNOSIS — D126 Benign neoplasm of colon, unspecified: Secondary | ICD-10-CM | POA: Diagnosis not present

## 2021-03-07 DIAGNOSIS — F418 Other specified anxiety disorders: Secondary | ICD-10-CM | POA: Diagnosis not present

## 2021-03-07 DIAGNOSIS — D122 Benign neoplasm of ascending colon: Secondary | ICD-10-CM | POA: Diagnosis not present

## 2021-03-07 DIAGNOSIS — K5730 Diverticulosis of large intestine without perforation or abscess without bleeding: Secondary | ICD-10-CM | POA: Diagnosis not present

## 2021-03-07 HISTORY — PX: COLONOSCOPY WITH PROPOFOL: SHX5780

## 2021-03-07 SURGERY — COLONOSCOPY WITH PROPOFOL
Anesthesia: General

## 2021-03-07 MED ORDER — PROPOFOL 500 MG/50ML IV EMUL
INTRAVENOUS | Status: DC | PRN
  Administered 2021-03-07: 150 ug/kg/min via INTRAVENOUS

## 2021-03-07 MED ORDER — SODIUM CHLORIDE 0.9 % IV SOLN: INTRAVENOUS | Status: DC

## 2021-03-07 MED ORDER — PROPOFOL 10 MG/ML IV BOLUS
INTRAVENOUS | Status: DC | PRN
  Administered 2021-03-07: 60 mg via INTRAVENOUS
  Administered 2021-03-07: 10 mg via INTRAVENOUS
  Administered 2021-03-07: 30 mg via INTRAVENOUS

## 2021-03-07 MED ORDER — DEXMEDETOMIDINE HCL 200 MCG/2ML IV SOLN
INTRAVENOUS | Status: DC | PRN
Start: 2021-03-07 — End: 2021-03-07
  Administered 2021-03-07: 12 ug via INTRAVENOUS

## 2021-03-07 MED ORDER — LIDOCAINE HCL (CARDIAC) PF 100 MG/5ML IV SOSY
PREFILLED_SYRINGE | INTRAVENOUS | Status: DC | PRN
  Administered 2021-03-07: 100 mg via INTRAVENOUS

## 2021-03-07 MED ORDER — SODIUM CHLORIDE 0.9 % IV SOLN: INTRAVENOUS | Status: DC | PRN

## 2021-03-07 NOTE — Anesthesia Postprocedure Evaluation (Signed)
Anesthesia Post Note  Patient: Brittany Savage  Procedure(s) Performed: COLONOSCOPY WITH PROPOFOL  Patient location during evaluation: PACU Anesthesia Type: General Level of consciousness: awake and alert, oriented and patient cooperative Pain management: pain level controlled Vital Signs Assessment: post-procedure vital signs reviewed and stable Respiratory status: spontaneous breathing, nonlabored ventilation and respiratory function stable Cardiovascular status: blood pressure returned to baseline and stable Postop Assessment: adequate PO intake Anesthetic complications: no   No notable events documented.   Last Vitals:  Vitals:   03/07/21 1136 03/07/21 1151  BP:  104/64  Pulse:    Resp:    Temp: (!) 36.2 C   SpO2:      Last Pain:  Vitals:   03/07/21 1151  TempSrc:   PainSc: 0-No pain                 Darrin Nipper

## 2021-03-07 NOTE — H&P (Signed)
Jonathon Bellows, MD [address] [place], [place], [place], [address] [address], [place], [place], [place], [address] Phone: [phone]  Fax: [phone]  Primary Care Physician:  Delsa Grana, PA-C   Pre-Procedure History & Physical: HPI:  Brittany Savage is a 50 y.o. female is here for an colonoscopy.   Past Medical History:  Diagnosis Date   Anxiety    Depression     Past Surgical History:  Procedure Laterality Date   DILATION AND CURETTAGE OF UTERUS  2006   NASAL SINUS SURGERY  2011    Prior to Admission medications   Medication Sig Start Date End Date Taking? Authorizing Provider  fluticasone (FLONASE) 50 MCG/ACT nasal spray Place 2 sprays into both nostrils daily. 11/18/19  Yes Delsa Grana, PA-C  minocycline (MINOCIN) 100 MG capsule Take 1 capsule (100 mg total) by mouth 2 (two) times daily as needed. 04/11/20  Yes Delsa Grana, PA-C  Multiple Vitamin (MULTIVITAMIN) tablet Take 1 tablet by mouth daily.   Yes [provider]  Omega-3 1000 MG CAPS Take by mouth daily.    Yes [provider]  Probiotic Product (PROBIOTIC DAILY PO) Take by mouth daily.   Yes [provider]  sertraline (ZOLOFT) 100 MG tablet TAKE 1 TABLET BY MOUTH EVERY DAY 04/11/20  Yes Delsa Grana, PA-C  meloxicam (MOBIC) 15 MG tablet meloxicam 15 mg tablet Patient not taking: Reported on 09/19/2020    [provider]    Allergies as of 02/27/2021 - Review Complete 09/19/2020  Allergen Reaction Noted   Nitrofurantoin  12/13/2009   Penicillins  12/13/2009   Sulfamethoxazole-trimethoprim  12/13/2009    Family History  Problem Relation Age of Onset   Breast cancer Mother    Heart disease Father    Colon cancer Maternal Grandfather     Social History   Socioeconomic History   Marital status: Married    Spouse name: Not on file   Number of children: Not on file   Years of education: Not on file   Highest education level: Not on file  Occupational  History   Not on file  Tobacco Use   Smoking status: Never   Smokeless tobacco: Never  Vaping Use   Vaping Use: Never used  Substance and Sexual Activity   Alcohol use: Yes    Alcohol/week: 21.0 standard drinks    Types: 21 Glasses of wine per week    Comment: daily   Drug use: No   Sexual activity: Yes    Birth control/protection: None  Other Topics Concern   Not on file  Social History Narrative   Not on file   Social Determinants of Health   Financial Resource Strain: Not on file  Food Insecurity: Not on file  Transportation Needs: Not on file  Physical Activity: Not on file  Stress: Not on file  Social Connections: Not on file  Intimate Partner Violence: Not on file    Review of Systems: See HPI, otherwise negative ROS  Physical Exam: BP 116/78    Pulse (!) 59    Temp 97.7 F (36.5 C) (Temporal)    Resp 16    Ht 5\' 10"  (1.778 m)    Wt 64.9 kg    SpO2 100%    BMI 20.52 kg/m  General:   Alert,  pleasant and cooperative in NAD Head:  Normocephalic and atraumatic. Neck:  Supple; no masses or thyromegaly. Lungs:  Clear throughout to auscultation, normal respiratory effort.  Heart:  +S1, +S2, Regular rate and rhythm, No edema. Abdomen:  Soft, nontender and nondistended. Normal bowel sounds, without guarding, and without rebound.   Neurologic:  Alert and  oriented x4;  grossly normal neurologically.  Impression/Plan: Brittany Savage is here for an colonoscopy to be performed for Screening colonoscopy average risk   Risks, benefits, limitations, and alternatives regarding  colonoscopy have been reviewed with the patient.  Questions have been answered.  All parties agreeable.   Jonathon Bellows, MD  03/07/2021, 10:53 AM

## 2021-03-07 NOTE — Anesthesia Preprocedure Evaluation (Signed)
Anesthesia Evaluation  Patient identified by MRN, date of birth, ID band Patient awake    Reviewed: Allergy & Precautions, NPO status , Patient's Chart, lab work & pertinent test results  History of Anesthesia Complications Negative for: history of anesthetic complications  Airway Mallampati: I   Neck ROM: Full    Dental no notable dental hx.    Pulmonary neg pulmonary ROS,    Pulmonary exam normal breath sounds clear to auscultation       Cardiovascular Exercise Tolerance: Good negative cardio ROS Normal cardiovascular exam Rhythm:Regular Rate:Normal     Neuro/Psych PSYCHIATRIC DISORDERS Anxiety Depression negative neurological ROS     GI/Hepatic negative GI ROS,   Endo/Other  negative endocrine ROS  Renal/GU negative Renal ROS     Musculoskeletal   Abdominal   Peds  Hematology negative hematology ROS (+)   Anesthesia Other Findings   Reproductive/Obstetrics                             Anesthesia Physical Anesthesia Plan  ASA: 2  Anesthesia Plan: General   Post-op Pain Management:    Induction: Intravenous  PONV Risk Score and Plan: 3 and Propofol infusion, TIVA and Treatment may vary due to age or medical condition  Airway Management Planned: Natural Airway  Additional Equipment:   Intra-op Plan:   Post-operative Plan:   Informed Consent: I have reviewed the patients History and Physical, chart, labs and discussed the procedure including the risks, benefits and alternatives for the proposed anesthesia with the patient or authorized representative who has indicated his/her understanding and acceptance.       Plan Discussed with: CRNA  Anesthesia Plan Comments: (LMA/GETA backup discussed.  Patient consented for risks of anesthesia including but not limited to:  - adverse reactions to medications - damage to eyes, teeth, lips or other oral mucosa - nerve damage due  to positioning  - sore throat or hoarseness - damage to heart, brain, nerves, lungs, other parts of body or loss of life  Informed patient about role of CRNA in peri- and intra-operative care.  Patient voiced understanding.)        Anesthesia Quick Evaluation

## 2021-03-07 NOTE — Op Note (Signed)
[facility] Gastroenterology Patient Name: Brittany Savage Procedure Date: 03/07/2021 10:55 AM MRN: [id number] Account #: [id number] Date of Birth: Dec 30, 1970 Admit Type: Outpatient Age: 50 Room: [facility] ENDO ROOM 3 Gender: Female Note Status: Finalized Instrument Name: Jasper Riling [id number] Procedure:             Colonoscopy Indications:           Screening for colorectal malignant neoplasm Providers:             Jonathon Bellows MD, MD Referring MD:          Delsa Grana (Referring MD) Medicines:             Monitored Anesthesia Care Complications:         No immediate complications. Procedure:             Pre-Anesthesia Assessment:                        - Prior to the procedure, a History and Physical was                         performed, and patient medications, allergies and                         sensitivities were reviewed. The patient's tolerance                         of previous anesthesia was reviewed.                        - The risks and benefits of the procedure and the                         sedation options and risks were discussed with the                         patient. All questions were answered and informed                         consent was obtained.                        - ASA Grade Assessment: II - A patient with mild                         systemic disease.                        After obtaining informed consent, the colonoscope was                         passed under direct vision. Throughout the procedure,                         the patient's blood pressure, pulse, and oxygen                         saturations were monitored continuously. The                         Colonoscope was introduced through  the anus and                         advanced to the the cecum, identified by the                         appendiceal orifice. The colonoscopy was performed                         with ease. The patient tolerated the procedure well.                          The quality of the bowel preparation was good. Findings:      The perianal and digital rectal examinations were normal.      Two sessile polyps were found in the transverse colon and ascending       colon. The polyps were 4 to 6 mm in size. These polyps were removed with       a cold snare. Resection and retrieval were complete.      Multiple small and large-mouthed diverticula were found in the ascending       colon.      The exam was otherwise without abnormality on direct and retroflexion       views. Impression:            - Two 4 to 6 mm polyps in the transverse colon and in                         the ascending colon, removed with a cold snare.                         Resected and retrieved.                        - Diverticulosis in the ascending colon.                        - The examination was otherwise normal on direct and                         retroflexion views. Recommendation:        - Discharge patient to home (with escort).                        - Resume previous diet.                        - Continue present medications.                        - Await pathology results.                        - Repeat colonoscopy for surveillance based on                         pathology results. Procedure Code(s):     --- Professional ---                        [phone], Colonoscopy, flexible; with removal of  tumor(s), polyp(s), or other lesion(s) by snare                         technique Diagnosis Code(s):     --- Professional ---                        Z12.11, Encounter for screening for malignant neoplasm                         of colon                        K63.5, Polyp of colon                        K57.30, Diverticulosis of large intestine without                         perforation or abscess without bleeding CPT copyright 2019 [employer]. All rights reserved. The codes documented in this report are  preliminary and upon coder review may  be revised to meet current compliance requirements. Jonathon Bellows, MD Jonathon Bellows MD, MD 03/07/2021 11:22:27 AM This report has been signed electronically. Number of Addenda: 0 Note Initiated On: 03/07/2021 10:55 AM Scope Withdrawal Time: 0 hours 11 minutes 12 seconds  Total Procedure Duration: 0 hours 17 minutes 7 seconds  Estimated Blood Loss:  Estimated blood loss: none.      [facility]

## 2021-03-07 NOTE — Transfer of Care (Signed)
Immediate Anesthesia Transfer of Care Note  Patient: Brittany Savage  Procedure(s) Performed: COLONOSCOPY WITH PROPOFOL  Patient Location: Endoscopy Unit  Anesthesia Type:General  Level of Consciousness: drowsy  Airway & Oxygen Therapy: Patient Spontanous Breathing  Post-op Assessment: Report given to RN and Post -op Vital signs reviewed and stable  Post vital signs: Reviewed and stable  Last Vitals: see Epic flowsheet Vitals Value Taken Time  BP    Temp    Pulse    Resp    SpO2      Last Pain:  Vitals:   03/07/21 0937  TempSrc: Temporal  PainSc: 0-No pain         Complications: No notable events documented.

## 2021-03-08 ENCOUNTER — Ambulatory Visit
Admission: RE | Admit: 2021-03-08 | Discharge: 2021-03-08 | Disposition: A | Discharge status: Home / Self Care | Payer: PRIVATE HEALTH INSURANCE | Source: Ambulatory Visit | Attending: Obstetrics and Gynecology | Admitting: Obstetrics and Gynecology

## 2021-03-08 DIAGNOSIS — R928 Other abnormal and inconclusive findings on diagnostic imaging of breast: Secondary | ICD-10-CM

## 2021-03-08 LAB — SURGICAL PATHOLOGY

## 2021-03-18 DIAGNOSIS — Z803 Family history of malignant neoplasm of breast: Secondary | ICD-10-CM | POA: Diagnosis not present

## 2021-03-18 DIAGNOSIS — C50812 Malignant neoplasm of overlapping sites of left female breast: Secondary | ICD-10-CM | POA: Diagnosis not present

## 2021-03-18 DIAGNOSIS — C50912 Malignant neoplasm of unspecified site of left female breast: Secondary | ICD-10-CM | POA: Diagnosis not present

## 2021-03-20 DIAGNOSIS — C50912 Malignant neoplasm of unspecified site of left female breast: Secondary | ICD-10-CM | POA: Diagnosis not present

## 2021-03-20 DIAGNOSIS — C50812 Malignant neoplasm of overlapping sites of left female breast: Secondary | ICD-10-CM | POA: Diagnosis not present

## 2021-03-20 DIAGNOSIS — Z803 Family history of malignant neoplasm of breast: Secondary | ICD-10-CM | POA: Diagnosis not present

## 2021-03-20 DIAGNOSIS — C50512 Malignant neoplasm of lower-outer quadrant of left female breast: Secondary | ICD-10-CM | POA: Diagnosis not present

## 2021-03-21 DIAGNOSIS — C50912 Malignant neoplasm of unspecified site of left female breast: Secondary | ICD-10-CM | POA: Diagnosis not present

## 2021-03-27 ENCOUNTER — Encounter: Payer: Self-pay | Admitting: Gastroenterology

## 2021-03-28 ENCOUNTER — Other Ambulatory Visit: Payer: Self-pay

## 2021-04-09 DIAGNOSIS — N6489 Other specified disorders of breast: Secondary | ICD-10-CM | POA: Diagnosis not present

## 2021-04-09 DIAGNOSIS — C50912 Malignant neoplasm of unspecified site of left female breast: Secondary | ICD-10-CM | POA: Diagnosis not present

## 2021-04-09 DIAGNOSIS — Z4682 Encounter for fitting and adjustment of non-vascular catheter: Secondary | ICD-10-CM | POA: Diagnosis not present

## 2021-04-09 DIAGNOSIS — T797XXA Traumatic subcutaneous emphysema, initial encounter: Secondary | ICD-10-CM | POA: Diagnosis not present

## 2021-04-09 DIAGNOSIS — Z79899 Other long term (current) drug therapy: Secondary | ICD-10-CM | POA: Diagnosis not present

## 2021-04-09 DIAGNOSIS — N651 Disproportion of reconstructed breast: Secondary | ICD-10-CM | POA: Diagnosis not present

## 2021-04-09 DIAGNOSIS — Z170 Estrogen receptor positive status [ER+]: Secondary | ICD-10-CM | POA: Diagnosis not present

## 2021-04-09 DIAGNOSIS — R928 Other abnormal and inconclusive findings on diagnostic imaging of breast: Secondary | ICD-10-CM | POA: Diagnosis not present

## 2021-04-09 DIAGNOSIS — Z882 Allergy status to sulfonamides status: Secondary | ICD-10-CM | POA: Diagnosis not present

## 2021-04-09 DIAGNOSIS — Z803 Family history of malignant neoplasm of breast: Secondary | ICD-10-CM | POA: Diagnosis not present

## 2021-04-09 DIAGNOSIS — C50812 Malignant neoplasm of overlapping sites of left female breast: Secondary | ICD-10-CM | POA: Diagnosis not present

## 2021-04-09 DIAGNOSIS — Z880 Allergy status to penicillin: Secondary | ICD-10-CM | POA: Diagnosis not present

## 2021-04-17 DIAGNOSIS — C50912 Malignant neoplasm of unspecified site of left female breast: Secondary | ICD-10-CM | POA: Diagnosis not present

## 2021-04-17 DIAGNOSIS — Z170 Estrogen receptor positive status [ER+]: Secondary | ICD-10-CM | POA: Diagnosis not present

## 2021-04-18 DIAGNOSIS — F411 Generalized anxiety disorder: Secondary | ICD-10-CM | POA: Diagnosis not present

## 2021-04-18 DIAGNOSIS — C50912 Malignant neoplasm of unspecified site of left female breast: Secondary | ICD-10-CM | POA: Diagnosis not present

## 2021-04-23 DIAGNOSIS — Z510 Encounter for antineoplastic radiation therapy: Secondary | ICD-10-CM | POA: Diagnosis not present

## 2021-04-23 DIAGNOSIS — Z170 Estrogen receptor positive status [ER+]: Secondary | ICD-10-CM | POA: Diagnosis not present

## 2021-04-23 DIAGNOSIS — D0512 Intraductal carcinoma in situ of left breast: Secondary | ICD-10-CM | POA: Diagnosis not present

## 2021-04-23 DIAGNOSIS — C50812 Malignant neoplasm of overlapping sites of left female breast: Secondary | ICD-10-CM | POA: Diagnosis not present

## 2021-04-23 DIAGNOSIS — Z6821 Body mass index (BMI) 21.0-21.9, adult: Secondary | ICD-10-CM | POA: Diagnosis not present

## 2021-04-26 DIAGNOSIS — Z170 Estrogen receptor positive status [ER+]: Secondary | ICD-10-CM | POA: Diagnosis not present

## 2021-04-26 DIAGNOSIS — C50912 Malignant neoplasm of unspecified site of left female breast: Secondary | ICD-10-CM | POA: Diagnosis not present

## 2021-04-30 DIAGNOSIS — D0512 Intraductal carcinoma in situ of left breast: Secondary | ICD-10-CM | POA: Diagnosis not present

## 2021-04-30 DIAGNOSIS — Z510 Encounter for antineoplastic radiation therapy: Secondary | ICD-10-CM | POA: Diagnosis not present

## 2021-04-30 DIAGNOSIS — C50812 Malignant neoplasm of overlapping sites of left female breast: Secondary | ICD-10-CM | POA: Diagnosis not present

## 2021-05-02 ENCOUNTER — Telehealth: Payer: Self-pay | Admitting: Family Medicine

## 2021-05-02 DIAGNOSIS — J309 Allergic rhinitis, unspecified: Secondary | ICD-10-CM

## 2021-05-08 ENCOUNTER — Other Ambulatory Visit: Payer: Self-pay | Admitting: Family Medicine

## 2021-05-08 DIAGNOSIS — F4322 Adjustment disorder with anxiety: Secondary | ICD-10-CM

## 2021-05-08 DIAGNOSIS — Z760 Encounter for issue of repeat prescription: Secondary | ICD-10-CM

## 2021-05-08 NOTE — Telephone Encounter (Signed)
Pt stated she does not need any office visits for refills, pt was very rude, expected her Rx for fluticasone (FLONASE) 50 MCG/ACT nasal spray to be sent in. Pt stated we should be able to refill it based off just looking at her medical records.  ?

## 2021-05-08 NOTE — Telephone Encounter (Signed)
30 day given of depression med, pt needs appt ?

## 2021-05-08 NOTE — Telephone Encounter (Signed)
Pt stated she does not need any appts for refills FYI ?

## 2021-05-08 NOTE — Telephone Encounter (Signed)
Called pt no answer left vm to call back  °

## 2021-05-09 ENCOUNTER — Other Ambulatory Visit: Payer: Self-pay

## 2021-05-09 DIAGNOSIS — J309 Allergic rhinitis, unspecified: Secondary | ICD-10-CM

## 2021-05-09 MED ORDER — FLUTICASONE PROPIONATE 50 MCG/ACT NA SUSP: 2.0000 | Freq: Every day | NASAL | 6 refills | Status: DC

## 2021-05-09 NOTE — Telephone Encounter (Signed)
Spoke to pt relayed Erin-PA message and she was very upset. She has done an appointment with Dr.Andrews on 05/17/21 and alreadry refilled her Flonase rx. ?

## 2021-05-09 NOTE — Telephone Encounter (Signed)
She was so upset that wanted to speak to office manager and transferred call to our office manager. ?

## 2021-05-10 DIAGNOSIS — C50812 Malignant neoplasm of overlapping sites of left female breast: Secondary | ICD-10-CM | POA: Diagnosis not present

## 2021-05-10 DIAGNOSIS — Z803 Family history of malignant neoplasm of breast: Secondary | ICD-10-CM | POA: Diagnosis not present

## 2021-05-10 DIAGNOSIS — C50912 Malignant neoplasm of unspecified site of left female breast: Secondary | ICD-10-CM | POA: Diagnosis not present

## 2021-05-13 DIAGNOSIS — C50912 Malignant neoplasm of unspecified site of left female breast: Secondary | ICD-10-CM | POA: Diagnosis not present

## 2021-05-13 DIAGNOSIS — Z803 Family history of malignant neoplasm of breast: Secondary | ICD-10-CM | POA: Diagnosis not present

## 2021-05-13 DIAGNOSIS — C50812 Malignant neoplasm of overlapping sites of left female breast: Secondary | ICD-10-CM | POA: Diagnosis not present

## 2021-05-14 DIAGNOSIS — Z803 Family history of malignant neoplasm of breast: Secondary | ICD-10-CM | POA: Diagnosis not present

## 2021-05-14 DIAGNOSIS — C50812 Malignant neoplasm of overlapping sites of left female breast: Secondary | ICD-10-CM | POA: Diagnosis not present

## 2021-05-14 DIAGNOSIS — C50912 Malignant neoplasm of unspecified site of left female breast: Secondary | ICD-10-CM | POA: Diagnosis not present

## 2021-05-15 DIAGNOSIS — C50812 Malignant neoplasm of overlapping sites of left female breast: Secondary | ICD-10-CM | POA: Diagnosis not present

## 2021-05-15 DIAGNOSIS — C50912 Malignant neoplasm of unspecified site of left female breast: Secondary | ICD-10-CM | POA: Diagnosis not present

## 2021-05-15 DIAGNOSIS — Z803 Family history of malignant neoplasm of breast: Secondary | ICD-10-CM | POA: Diagnosis not present

## 2021-05-16 DIAGNOSIS — Z803 Family history of malignant neoplasm of breast: Secondary | ICD-10-CM | POA: Diagnosis not present

## 2021-05-16 DIAGNOSIS — C50812 Malignant neoplasm of overlapping sites of left female breast: Secondary | ICD-10-CM | POA: Diagnosis not present

## 2021-05-16 DIAGNOSIS — C50912 Malignant neoplasm of unspecified site of left female breast: Secondary | ICD-10-CM | POA: Diagnosis not present

## 2021-05-17 ENCOUNTER — Encounter: Payer: Self-pay | Admitting: Internal Medicine

## 2021-05-17 ENCOUNTER — Telehealth (INDEPENDENT_AMBULATORY_CARE_PROVIDER_SITE_OTHER): Payer: Self-pay | Admitting: Internal Medicine

## 2021-05-17 DIAGNOSIS — C50912 Malignant neoplasm of unspecified site of left female breast: Secondary | ICD-10-CM | POA: Diagnosis not present

## 2021-05-17 DIAGNOSIS — C50812 Malignant neoplasm of overlapping sites of left female breast: Secondary | ICD-10-CM | POA: Diagnosis not present

## 2021-05-17 DIAGNOSIS — J309 Allergic rhinitis, unspecified: Secondary | ICD-10-CM

## 2021-05-17 DIAGNOSIS — Z760 Encounter for issue of repeat prescription: Secondary | ICD-10-CM

## 2021-05-17 DIAGNOSIS — Z803 Family history of malignant neoplasm of breast: Secondary | ICD-10-CM | POA: Diagnosis not present

## 2021-05-17 DIAGNOSIS — F4322 Adjustment disorder with anxiety: Secondary | ICD-10-CM

## 2021-05-17 MED ORDER — FLUTICASONE PROPIONATE 50 MCG/ACT NA SUSP: 2.0000 | Freq: Every day | NASAL | 6 refills | Status: DC

## 2021-05-17 MED ORDER — SERTRALINE HCL 100 MG PO TABS: ORAL_TABLET | ORAL | 3 refills | Status: DC

## 2021-05-17 NOTE — Progress Notes (Deleted)
Virtual Visit via Video Note ? ?I connected with Brittany Savage on 05/17/21 at  1:40 PM EST by a video enabled telemedicine application and verified that I am speaking with the correct person using two identifiers. ? ?Location: ?Patient: Home ?Provider: [facility] ?  ?I discussed the limitations of evaluation and management by telemedicine and the availability of in person appointments. The patient expressed understanding and agreed to proceed. ? ?History of Present Illness: ? ?Brittany Savage is a 51 year old female presenting via telemedicine for follow up.  ? ?Anxiety: ? ?-Duration:{Blank single:19197::"controlled","uncontrolled","better","worse","exacerbated","stable"} ?-Anxious mood: {Blank single:19197::"yes","no"}  ?-Excessive worrying: {Blank single:19197::"yes","no"} ?-Irritability: {Blank single:19197::"yes","no"}  ?-Sweating: {Blank single:19197::"yes","no"} ?-Nausea: {Blank single:19197::"yes","no"} ?-Palpitations:{Blank single:19197::"yes","no"} ?-Hyperventilation: {Blank single:19197::"yes","no"} ?-Panic attacks: {Blank single:19197::"yes","no"} ?-Agoraphobia: {Blank single:19197::"yes","no"}  ?-Obsessions/compulsions: {Blank single:19197::"yes","no"} ?-Depressed mood: {Blank single:19197::"yes","no"} ?Depression screen [facility] 2/9 04/11/2020 06/23/2019 03/02/2019 12/09/2017 11/26/2017  ?Decreased Interest 0 0 0 0 0  ?Down, Depressed, Hopeless 0 0 0 0 0  ?PHQ - 2 Score 0 0 0 0 0  ?Altered sleeping 0 0 0 0 3  ?Tired, decreased energy 0 0 0 0 3  ?Change in appetite 0 0 0 0 0  ?Feeling bad or failure about yourself  0 0 0 0 0  ?Trouble concentrating 0 0 0 0 0  ?Moving slowly or fidgety/restless 0 0 0 0 0  ?Suicidal thoughts 0 0 0 0 0  ?PHQ-9 Score 0 0 0 0 6  ?Difficult doing work/chores Not difficult at all Not difficult at all Not difficult at all - -  ? ?-Anhedonia: {Blank single:19197::"yes","no"} ?-Weight changes: {Blank single:19197::"yes","no"} ?-Insomnia: {Blank single:19197::"yes","no"} {Blank  single:19197::"hard to fall asleep","hard to stay asleep"}  ?-Hypersomnia: {Blank single:19197::"yes","no"} ?-Fatigue/loss of energy: {Blank single:19197::"yes","no"} ?-Feelings of worthlessness: {Blank single:19197::"yes","no"} ?-Feelings of guilt: {Blank single:19197::"yes","no"} ?-Impaired concentration/indecisiveness: {Blank single:19197::"yes","no"} ?-Suicidal ideations: {Blank single:19197::"yes","no"}  ?-Crying spells: {Blank single:19197::"yes","no"} ?-Recent Stressors/Life Changes: {Blank single:19197::"yes","no"} ?  Relationship problems: {Blank single:19197::"yes","no"} ?  Family stress: {Blank single:19197::"yes","no"}   ?  Financial stress: {Blank single:19197::"yes","no"}  ?  Job stress: {Blank single:19197::"yes","no"}  ?  Recent death/loss: {Blank single:19197::"yes","no"} ?-Current Treatments: Zoloft 100 ?-Patient is compliant with the above medications at above dose and reports no side effects. *** ?-Past Treatments: ?-Counseling: *** ? ?Allergic Rhinitis: ?-Currently on Flonase ? ? ?  ?Observations/Objective: ? ? ?Assessment and Plan: ? ? ?Follow Up Instructions: ? ?  ?I discussed the assessment and treatment plan with the patient. The patient was provided an opportunity to ask questions and all were answered. The patient agreed with the plan and demonstrated an understanding of the instructions. ?  ?The patient was advised to call back or seek an in-person evaluation if the symptoms worsen or if the condition fails to improve as anticipated. ? ?I provided *** minutes of non-face-to-face time during this encounter. ? ? ?Teodora Medici, DO ? ?

## 2021-05-17 NOTE — Progress Notes (Signed)
Virtual Visit via Telephone Note ? ?I connected with Brittany Savage on 05/17/21 at  1:40 PM EST by telephone and verified that I am speaking with the correct person using two identifiers. ? ?Location: ?Patient: Home ?Provider: [facility] ?  ?I discussed the limitations, risks, security and privacy concerns of performing an evaluation and management service by telephone and the availability of in person appointments. I also discussed with the patient that there may be a patient responsible charge related to this service. The patient expressed understanding and agreed to proceed. ? ?History of Present Illness: ? ?Brittany Savage is a 51 year old female presenting via phone call for follow up. Second week radiation.  ? ?Anxiety: ?-Duration:controlled ?-Anxious mood: no - recently diagnosed with breast cancer, underwent mastectomy and is currently in her second week of radiation treatment. Overall doing well, stable on medication.  ? ?Depression screen [facility] 2/9 05/17/2021 04/11/2020 06/23/2019 03/02/2019 12/09/2017  ?Decreased Interest 0 0 0 0 0  ?Down, Depressed, Hopeless 0 0 0 0 0  ?PHQ - 2 Score 0 0 0 0 0  ?Altered sleeping 0 0 0 0 0  ?Tired, decreased energy 0 0 0 0 0  ?Change in appetite 0 0 0 0 0  ?Feeling bad or failure about yourself  0 0 0 0 0  ?Trouble concentrating 0 0 0 0 0  ?Moving slowly or fidgety/restless 0 0 0 0 0  ?Suicidal thoughts 0 0 0 0 0  ?PHQ-9 Score 0 0 0 0 0  ?Difficult doing work/chores Not difficult at all Not difficult at all Not difficult at all Not difficult at all -  ? ?-Current Treatments: Zoloft 100 ?-Patient is compliant with the above medications at above dose and reports no side effects.  ?-Counseling: Not currently  ? ?Allergic Rhinitis: ?-Currently on Flonase, controlling allergy symptoms well.   ?  ?Observations/Objective: ? ?General: well appearing, no acute distress ?Neuro: answers all questions appropriately  ? ?Assessment and Plan: ? ?1. Adjustment disorder with anxious  mood/Medication refill: Stable, Zoloft refilled today. ? ?- sertraline (ZOLOFT) 100 MG tablet; TAKE 1 TABLET BY MOUTH DAILY  Dispense: 90 tablet; Refill: 3 ? ?2. Allergic rhinitis, unspecified seasonality, unspecified trigger: Doing well, needs refills.  ? ?- fluticasone (FLONASE) 50 MCG/ACT nasal spray; Place 2 sprays into both nostrils daily.  Dispense: 16 g; Refill: 6 ? ?Follow Up Instructions: 1 year or sooner as needed ? ?  ?I discussed the assessment and treatment plan with the patient. The patient was provided an opportunity to ask questions and all were answered. The patient agreed with the plan and demonstrated an understanding of the instructions. ?  ?The patient was advised to call back or seek an in-person evaluation if the symptoms worsen or if the condition fails to improve as anticipated. ? ?I provided 10 minutes of non-face-to-face time during this encounter. ? ? ?Teodora Medici, DO ? ?

## 2021-05-17 NOTE — Patient Instructions (Signed)
It was great seeing you today! ? ?Plan discussed at today's visit: ?-Medications refilled ? ?Follow up in: 1 year  ? ?Take care and let us know if you have any questions or concerns prior to your next visit. ? ?Dr. Rosana Berger ? ?

## 2021-05-20 DIAGNOSIS — Z803 Family history of malignant neoplasm of breast: Secondary | ICD-10-CM | POA: Diagnosis not present

## 2021-05-20 DIAGNOSIS — C50912 Malignant neoplasm of unspecified site of left female breast: Secondary | ICD-10-CM | POA: Diagnosis not present

## 2021-05-20 DIAGNOSIS — C50812 Malignant neoplasm of overlapping sites of left female breast: Secondary | ICD-10-CM | POA: Diagnosis not present

## 2021-05-21 DIAGNOSIS — C50912 Malignant neoplasm of unspecified site of left female breast: Secondary | ICD-10-CM | POA: Diagnosis not present

## 2021-05-21 DIAGNOSIS — Z803 Family history of malignant neoplasm of breast: Secondary | ICD-10-CM | POA: Diagnosis not present

## 2021-05-21 DIAGNOSIS — C50812 Malignant neoplasm of overlapping sites of left female breast: Secondary | ICD-10-CM | POA: Diagnosis not present

## 2021-05-22 DIAGNOSIS — C50912 Malignant neoplasm of unspecified site of left female breast: Secondary | ICD-10-CM | POA: Diagnosis not present

## 2021-05-22 DIAGNOSIS — C50812 Malignant neoplasm of overlapping sites of left female breast: Secondary | ICD-10-CM | POA: Diagnosis not present

## 2021-05-22 DIAGNOSIS — Z803 Family history of malignant neoplasm of breast: Secondary | ICD-10-CM | POA: Diagnosis not present

## 2021-05-23 DIAGNOSIS — Z803 Family history of malignant neoplasm of breast: Secondary | ICD-10-CM | POA: Diagnosis not present

## 2021-05-23 DIAGNOSIS — C50812 Malignant neoplasm of overlapping sites of left female breast: Secondary | ICD-10-CM | POA: Diagnosis not present

## 2021-05-23 DIAGNOSIS — C50912 Malignant neoplasm of unspecified site of left female breast: Secondary | ICD-10-CM | POA: Diagnosis not present

## 2021-05-24 DIAGNOSIS — Z803 Family history of malignant neoplasm of breast: Secondary | ICD-10-CM | POA: Diagnosis not present

## 2021-05-24 DIAGNOSIS — C50912 Malignant neoplasm of unspecified site of left female breast: Secondary | ICD-10-CM | POA: Diagnosis not present

## 2021-05-24 DIAGNOSIS — C50812 Malignant neoplasm of overlapping sites of left female breast: Secondary | ICD-10-CM | POA: Diagnosis not present

## 2021-05-27 DIAGNOSIS — Z803 Family history of malignant neoplasm of breast: Secondary | ICD-10-CM | POA: Diagnosis not present

## 2021-05-27 DIAGNOSIS — C50912 Malignant neoplasm of unspecified site of left female breast: Secondary | ICD-10-CM | POA: Diagnosis not present

## 2021-05-27 DIAGNOSIS — C50812 Malignant neoplasm of overlapping sites of left female breast: Secondary | ICD-10-CM | POA: Diagnosis not present

## 2021-05-28 DIAGNOSIS — C50812 Malignant neoplasm of overlapping sites of left female breast: Secondary | ICD-10-CM | POA: Diagnosis not present

## 2021-05-28 DIAGNOSIS — Z803 Family history of malignant neoplasm of breast: Secondary | ICD-10-CM | POA: Diagnosis not present

## 2021-05-28 DIAGNOSIS — C50912 Malignant neoplasm of unspecified site of left female breast: Secondary | ICD-10-CM | POA: Diagnosis not present

## 2021-05-29 DIAGNOSIS — C50812 Malignant neoplasm of overlapping sites of left female breast: Secondary | ICD-10-CM | POA: Diagnosis not present

## 2021-05-29 DIAGNOSIS — C50912 Malignant neoplasm of unspecified site of left female breast: Secondary | ICD-10-CM | POA: Diagnosis not present

## 2021-05-29 DIAGNOSIS — Z803 Family history of malignant neoplasm of breast: Secondary | ICD-10-CM | POA: Diagnosis not present

## 2021-05-30 DIAGNOSIS — C50812 Malignant neoplasm of overlapping sites of left female breast: Secondary | ICD-10-CM | POA: Diagnosis not present

## 2021-05-30 DIAGNOSIS — D2261 Melanocytic nevi of right upper limb, including shoulder: Secondary | ICD-10-CM | POA: Diagnosis not present

## 2021-05-30 DIAGNOSIS — L700 Acne vulgaris: Secondary | ICD-10-CM | POA: Diagnosis not present

## 2021-05-30 DIAGNOSIS — C50912 Malignant neoplasm of unspecified site of left female breast: Secondary | ICD-10-CM | POA: Diagnosis not present

## 2021-05-30 DIAGNOSIS — D225 Melanocytic nevi of trunk: Secondary | ICD-10-CM | POA: Diagnosis not present

## 2021-05-30 DIAGNOSIS — Z803 Family history of malignant neoplasm of breast: Secondary | ICD-10-CM | POA: Diagnosis not present

## 2021-05-30 DIAGNOSIS — D2262 Melanocytic nevi of left upper limb, including shoulder: Secondary | ICD-10-CM | POA: Diagnosis not present

## 2021-05-31 DIAGNOSIS — Z803 Family history of malignant neoplasm of breast: Secondary | ICD-10-CM | POA: Diagnosis not present

## 2021-05-31 DIAGNOSIS — C50812 Malignant neoplasm of overlapping sites of left female breast: Secondary | ICD-10-CM | POA: Diagnosis not present

## 2021-05-31 DIAGNOSIS — C50912 Malignant neoplasm of unspecified site of left female breast: Secondary | ICD-10-CM | POA: Diagnosis not present

## 2021-06-03 DIAGNOSIS — Z803 Family history of malignant neoplasm of breast: Secondary | ICD-10-CM | POA: Diagnosis not present

## 2021-06-03 DIAGNOSIS — C50912 Malignant neoplasm of unspecified site of left female breast: Secondary | ICD-10-CM | POA: Diagnosis not present

## 2021-06-03 DIAGNOSIS — C50812 Malignant neoplasm of overlapping sites of left female breast: Secondary | ICD-10-CM | POA: Diagnosis not present

## 2021-06-04 DIAGNOSIS — C50812 Malignant neoplasm of overlapping sites of left female breast: Secondary | ICD-10-CM | POA: Diagnosis not present

## 2021-06-04 DIAGNOSIS — Z803 Family history of malignant neoplasm of breast: Secondary | ICD-10-CM | POA: Diagnosis not present

## 2021-06-04 DIAGNOSIS — C50912 Malignant neoplasm of unspecified site of left female breast: Secondary | ICD-10-CM | POA: Diagnosis not present

## 2021-06-05 DIAGNOSIS — C50912 Malignant neoplasm of unspecified site of left female breast: Secondary | ICD-10-CM | POA: Diagnosis not present

## 2021-06-05 DIAGNOSIS — Z803 Family history of malignant neoplasm of breast: Secondary | ICD-10-CM | POA: Diagnosis not present

## 2021-06-05 DIAGNOSIS — C50812 Malignant neoplasm of overlapping sites of left female breast: Secondary | ICD-10-CM | POA: Diagnosis not present

## 2021-06-10 DIAGNOSIS — C50812 Malignant neoplasm of overlapping sites of left female breast: Secondary | ICD-10-CM | POA: Diagnosis not present

## 2021-06-10 DIAGNOSIS — C50912 Malignant neoplasm of unspecified site of left female breast: Secondary | ICD-10-CM | POA: Diagnosis not present

## 2021-06-10 DIAGNOSIS — Z803 Family history of malignant neoplasm of breast: Secondary | ICD-10-CM | POA: Diagnosis not present

## 2021-06-11 DIAGNOSIS — C50812 Malignant neoplasm of overlapping sites of left female breast: Secondary | ICD-10-CM | POA: Diagnosis not present

## 2021-06-11 DIAGNOSIS — Z803 Family history of malignant neoplasm of breast: Secondary | ICD-10-CM | POA: Diagnosis not present

## 2021-06-11 DIAGNOSIS — C50912 Malignant neoplasm of unspecified site of left female breast: Secondary | ICD-10-CM | POA: Diagnosis not present

## 2021-06-12 DIAGNOSIS — C50912 Malignant neoplasm of unspecified site of left female breast: Secondary | ICD-10-CM | POA: Diagnosis not present

## 2021-06-12 DIAGNOSIS — C50812 Malignant neoplasm of overlapping sites of left female breast: Secondary | ICD-10-CM | POA: Diagnosis not present

## 2021-06-12 DIAGNOSIS — Z803 Family history of malignant neoplasm of breast: Secondary | ICD-10-CM | POA: Diagnosis not present

## 2021-07-04 DIAGNOSIS — Z6821 Body mass index (BMI) 21.0-21.9, adult: Secondary | ICD-10-CM | POA: Diagnosis not present

## 2021-07-04 DIAGNOSIS — C50912 Malignant neoplasm of unspecified site of left female breast: Secondary | ICD-10-CM | POA: Diagnosis not present

## 2021-10-04 DIAGNOSIS — R5383 Other fatigue: Secondary | ICD-10-CM | POA: Diagnosis not present

## 2021-10-04 DIAGNOSIS — Z6822 Body mass index (BMI) 22.0-22.9, adult: Secondary | ICD-10-CM | POA: Diagnosis not present

## 2021-10-04 DIAGNOSIS — C50912 Malignant neoplasm of unspecified site of left female breast: Secondary | ICD-10-CM | POA: Diagnosis not present

## 2021-10-04 DIAGNOSIS — Z170 Estrogen receptor positive status [ER+]: Secondary | ICD-10-CM | POA: Diagnosis not present

## 2021-10-04 DIAGNOSIS — F419 Anxiety disorder, unspecified: Secondary | ICD-10-CM | POA: Diagnosis not present

## 2021-10-04 DIAGNOSIS — C50812 Malignant neoplasm of overlapping sites of left female breast: Secondary | ICD-10-CM | POA: Diagnosis not present

## 2021-10-04 DIAGNOSIS — Z9010 Acquired absence of unspecified breast and nipple: Secondary | ICD-10-CM | POA: Diagnosis not present

## 2021-10-04 DIAGNOSIS — Z923 Personal history of irradiation: Secondary | ICD-10-CM | POA: Diagnosis not present

## 2021-10-25 DIAGNOSIS — F411 Generalized anxiety disorder: Secondary | ICD-10-CM | POA: Diagnosis not present

## 2021-10-25 DIAGNOSIS — Z79899 Other long term (current) drug therapy: Secondary | ICD-10-CM | POA: Diagnosis not present

## 2021-10-25 DIAGNOSIS — Z6822 Body mass index (BMI) 22.0-22.9, adult: Secondary | ICD-10-CM | POA: Diagnosis not present

## 2021-10-25 DIAGNOSIS — R1031 Right lower quadrant pain: Secondary | ICD-10-CM | POA: Diagnosis not present

## 2021-10-25 DIAGNOSIS — Z170 Estrogen receptor positive status [ER+]: Secondary | ICD-10-CM | POA: Diagnosis not present

## 2021-10-25 DIAGNOSIS — N644 Mastodynia: Secondary | ICD-10-CM | POA: Diagnosis not present

## 2021-10-25 DIAGNOSIS — C50912 Malignant neoplasm of unspecified site of left female breast: Secondary | ICD-10-CM | POA: Diagnosis not present

## 2021-10-25 DIAGNOSIS — Z923 Personal history of irradiation: Secondary | ICD-10-CM | POA: Diagnosis not present

## 2021-10-25 DIAGNOSIS — Z882 Allergy status to sulfonamides status: Secondary | ICD-10-CM | POA: Diagnosis not present

## 2021-10-25 DIAGNOSIS — Z79810 Long term (current) use of selective estrogen receptor modulators (SERMs): Secondary | ICD-10-CM | POA: Diagnosis not present

## 2021-10-25 DIAGNOSIS — C50812 Malignant neoplasm of overlapping sites of left female breast: Secondary | ICD-10-CM | POA: Diagnosis not present

## 2021-10-25 DIAGNOSIS — Z880 Allergy status to penicillin: Secondary | ICD-10-CM | POA: Diagnosis not present

## 2021-10-25 DIAGNOSIS — Z803 Family history of malignant neoplasm of breast: Secondary | ICD-10-CM | POA: Diagnosis not present

## 2021-10-25 DIAGNOSIS — Z881 Allergy status to other antibiotic agents status: Secondary | ICD-10-CM | POA: Diagnosis not present

## 2021-10-25 LAB — HM MAMMOGRAPHY

## 2022-02-06 DIAGNOSIS — I890 Lymphedema, not elsewhere classified: Secondary | ICD-10-CM | POA: Diagnosis not present

## 2022-02-06 DIAGNOSIS — Z170 Estrogen receptor positive status [ER+]: Secondary | ICD-10-CM | POA: Diagnosis not present

## 2022-02-06 DIAGNOSIS — C50812 Malignant neoplasm of overlapping sites of left female breast: Secondary | ICD-10-CM | POA: Diagnosis not present

## 2022-02-06 DIAGNOSIS — C50912 Malignant neoplasm of unspecified site of left female breast: Secondary | ICD-10-CM | POA: Diagnosis not present

## 2022-02-06 DIAGNOSIS — Z79810 Long term (current) use of selective estrogen receptor modulators (SERMs): Secondary | ICD-10-CM | POA: Diagnosis not present

## 2022-03-11 DIAGNOSIS — Z6822 Body mass index (BMI) 22.0-22.9, adult: Secondary | ICD-10-CM | POA: Diagnosis not present

## 2022-03-11 DIAGNOSIS — Z124 Encounter for screening for malignant neoplasm of cervix: Secondary | ICD-10-CM | POA: Diagnosis not present

## 2022-03-11 DIAGNOSIS — Z01419 Encounter for gynecological examination (general) (routine) without abnormal findings: Secondary | ICD-10-CM | POA: Diagnosis not present

## 2022-03-13 DIAGNOSIS — C50812 Malignant neoplasm of overlapping sites of left female breast: Secondary | ICD-10-CM | POA: Diagnosis not present

## 2022-03-13 DIAGNOSIS — I890 Lymphedema, not elsewhere classified: Secondary | ICD-10-CM | POA: Diagnosis not present

## 2022-03-13 DIAGNOSIS — L7682 Other postprocedural complications of skin and subcutaneous tissue: Secondary | ICD-10-CM | POA: Diagnosis not present

## 2022-03-13 DIAGNOSIS — L905 Scar conditions and fibrosis of skin: Secondary | ICD-10-CM | POA: Diagnosis not present

## 2022-03-28 DIAGNOSIS — N959 Unspecified menopausal and perimenopausal disorder: Secondary | ICD-10-CM | POA: Diagnosis not present

## 2022-03-28 DIAGNOSIS — E559 Vitamin D deficiency, unspecified: Secondary | ICD-10-CM | POA: Diagnosis not present

## 2022-03-28 DIAGNOSIS — E612 Magnesium deficiency: Secondary | ICD-10-CM | POA: Diagnosis not present

## 2022-03-28 DIAGNOSIS — E611 Iron deficiency: Secondary | ICD-10-CM | POA: Diagnosis not present

## 2022-03-28 DIAGNOSIS — Z1329 Encounter for screening for other suspected endocrine disorder: Secondary | ICD-10-CM | POA: Diagnosis not present

## 2022-03-28 DIAGNOSIS — Z13220 Encounter for screening for lipoid disorders: Secondary | ICD-10-CM | POA: Diagnosis not present

## 2022-03-28 DIAGNOSIS — E538 Deficiency of other specified B group vitamins: Secondary | ICD-10-CM | POA: Diagnosis not present

## 2022-03-28 DIAGNOSIS — K5900 Constipation, unspecified: Secondary | ICD-10-CM | POA: Diagnosis not present

## 2022-05-19 ENCOUNTER — Ambulatory Visit
Admission: RE | Admit: 2022-05-19 | Discharge: 2022-05-19 | Disposition: A | Discharge status: Home / Self Care | Payer: BC Managed Care – PPO | Attending: Internal Medicine | Admitting: Internal Medicine

## 2022-05-19 ENCOUNTER — Ambulatory Visit
Admission: RE | Admit: 2022-05-19 | Discharge: 2022-05-19 | Disposition: A | Discharge status: Home / Self Care | Payer: BC Managed Care – PPO | Source: Ambulatory Visit | Attending: Internal Medicine | Admitting: Internal Medicine

## 2022-05-19 ENCOUNTER — Encounter: Payer: Self-pay | Admitting: Internal Medicine

## 2022-05-19 ENCOUNTER — Ambulatory Visit (INDEPENDENT_AMBULATORY_CARE_PROVIDER_SITE_OTHER): Payer: BC Managed Care – PPO | Admitting: Internal Medicine

## 2022-05-19 VITALS — BP 116/60 | HR 100 | Temp 98.0°F | Resp 16 | Ht 70.0 in | Wt 155.5 lb

## 2022-05-19 DIAGNOSIS — C50919 Malignant neoplasm of unspecified site of unspecified female breast: Secondary | ICD-10-CM | POA: Diagnosis not present

## 2022-05-19 DIAGNOSIS — M25562 Pain in left knee: Secondary | ICD-10-CM

## 2022-05-19 DIAGNOSIS — F4322 Adjustment disorder with anxiety: Secondary | ICD-10-CM | POA: Diagnosis not present

## 2022-05-19 DIAGNOSIS — M25462 Effusion, left knee: Secondary | ICD-10-CM

## 2022-05-19 DIAGNOSIS — Z13220 Encounter for screening for lipoid disorders: Secondary | ICD-10-CM

## 2022-05-19 DIAGNOSIS — Z760 Encounter for issue of repeat prescription: Secondary | ICD-10-CM | POA: Diagnosis not present

## 2022-05-19 DIAGNOSIS — J309 Allergic rhinitis, unspecified: Secondary | ICD-10-CM

## 2022-05-19 LAB — COMPLETE METABOLIC PANEL WITH GFR
AG Ratio: 2 (calc) (ref 1.0–2.5)
ALT: 16 U/L (ref 6–29)
AST: 19 U/L (ref 10–35)
Albumin: 4.5 g/dL (ref 3.6–5.1)
Alkaline phosphatase (APISO): 29 U/L — ABNORMAL LOW (ref 37–153)
BUN: 10 mg/dL (ref 7–25)
CO2: 26 mmol/L (ref 20–32)
Calcium: 9.4 mg/dL (ref 8.6–10.4)
Chloride: 106 mmol/L (ref 98–110)
Creat: 0.77 mg/dL (ref 0.50–1.03)
Globulin: 2.3 g/dL (calc) (ref 1.9–3.7)
Glucose, Bld: 103 mg/dL — ABNORMAL HIGH (ref 65–99)
Potassium: 4.1 mmol/L (ref 3.5–5.3)
Sodium: 140 mmol/L (ref 135–146)
Total Bilirubin: 0.6 mg/dL (ref 0.2–1.2)
Total Protein: 6.8 g/dL (ref 6.1–8.1)
eGFR: 92 mL/min/{1.73_m2} (ref >=60)

## 2022-05-19 LAB — CBC WITH DIFFERENTIAL/PLATELET
Absolute Monocytes: 376 cells/uL (ref 200–950)
Basophils Absolute: 37 cells/uL (ref 0–200)
Basophils Relative: 0.7 %
Eosinophils Absolute: 69 cells/uL (ref 15–500)
Eosinophils Relative: 1.3 %
HCT: 37.3 % (ref 35.0–45.0)
Hemoglobin: 12.3 g/dL (ref 11.7–15.5)
Lymphs Abs: 1765 cells/uL (ref 850–3900)
MCH: 32 pg (ref 27.0–33.0)
MCHC: 33 g/dL (ref 32.0–36.0)
MCV: 97.1 fL (ref 80.0–100.0)
MPV: 10.9 fL (ref 7.5–12.5)
Monocytes Relative: 7.1 %
Neutro Abs: 3053 cells/uL (ref 1500–7800)
Neutrophils Relative %: 57.6 %
Platelets: 231 10*3/uL (ref 140–400)
RBC: 3.84 10*6/uL (ref 3.80–5.10)
RDW: 11.7 % (ref 11.0–15.0)
Total Lymphocyte: 33.3 %
WBC: 5.3 10*3/uL (ref 3.8–10.8)

## 2022-05-19 LAB — LIPID PANEL
Cholesterol: 154 mg/dL (ref <200)
HDL: 68 mg/dL (ref >=50)
LDL Cholesterol (Calc): 71 mg/dL (calc)
Non-HDL Cholesterol (Calc): 86 mg/dL (calc) (ref <130)
Total CHOL/HDL Ratio: 2.3 (calc) (ref <5.0)
Triglycerides: 70 mg/dL (ref <150)

## 2022-05-19 MED ORDER — FLUTICASONE PROPIONATE 50 MCG/ACT NA SUSP: 2.0000 | Freq: Every day | NASAL | 6 refills | Status: DC

## 2022-05-19 MED ORDER — SERTRALINE HCL 100 MG PO TABS: ORAL_TABLET | ORAL | 3 refills | Status: DC

## 2022-05-19 NOTE — Patient Instructions (Addendum)
It was great seeing you today!  Plan discussed at today's visit: -Blood work ordered today, results will be uploaded to MyChart.  -Medications refilled  -X-ray of knee ordered - continue to use ice/heat, keep knee elevated and can take anti-inflammatories as needed  Follow up in: 1 year or sooner as needed  Take care and let us know if you have any questions or concerns prior to your next visit.  Dr. Rosana Berger  Bursitis  Bursitis is inflammation and irritation of a bursa, which is a small fluid-filled sac that cushions and protects the joints. These sacs are located between bones and muscles, bones and muscle attachments, or bones and skin areas that are next to bones. A bursa protects those structures from the wear and tear that results from frequent movement. If the bursa becomes irritated, it can fill with extra fluid. Bursitis is most common near joints, especially the knees, elbows, hips, and shoulders. What are the causes? This condition may be caused by: An injury like a direct hit to a joint area, such as falling on your knee or elbow. Overuse of a joint (repetitive stress). Infection. This can happen if bacteria get into a bursa through a cut or scrape near a joint. Diseases that cause joint inflammation, such as gout and rheumatoid arthritis. What increases the risk? You are more likely to develop this condition if: You have a job or hobby that involves a lot of repetitive stress on your joints. You have a condition that weakens your body's defense system (immune system), such as diabetes, cancer, or HIV. You do any of these often: Lift and reach overhead. Kneel or lean on hard surfaces. Participate in physical activities that include repetitive motion, like running or walking. What are the signs or symptoms? Common symptoms of this condition include: Pain that gets worse when you move the affected body part or use it to support your body  weight. Inflammation. Stiffness. Other symptoms include: Redness. Swelling. Tenderness. Warmth. Pain that continues after rest. Fever or chills. These may occur in bursitis that is caused by infection. How is this diagnosed? This condition may be diagnosed based on: Your medical history and a physical exam. Imaging tests, such as an MRI or X-ray. Draining fluid from the bursa to test it for infection or gout. Blood tests to rule out other causes of inflammation. How is this treated? This condition can usually be treated at home with rest, ice, applying pressure (compression), and raising the body part that is affected (elevation). This is called RICE therapy. For mild bursitis, RICE therapy may be all you need. Other treatments may include: Over-the-counter medicines to relieve pain and inflammation. Injections of anti-inflammatory medicines. These medicines may be injected into and around the area of bursitis. Draining of fluid from the bursa to relieve pain and improve movement. Antibiotic medicine if there is an infection. Using a splint, brace, elastic wrap, pads, or walking aid, such as a cane. Physical or occupational therapy if you continue to have pain or limited movement. Your physical or occupational therapist can help determine what may have caused or contributed to the bursitis. This will help avoid further episodes. Surgery to remove a damaged or infected bursa. This may be needed if other treatments have not worked. Follow these instructions at home: Medicines Take over-the-counter and prescription medicines only as told by your health care provider. If you were prescribed an antibiotic medicine, take it as told by your health care provider. Do not stop using the  antibiotic even if you start to feel better. Managing pain, stiffness, and swelling     Raise (elevate) the injured area above the level of your heart while you are sitting or lying down. If directed, put ice  on the affected area. To do this: Put ice in a plastic bag. Place a towel between your skin and the bag, or between your splint or brace and the bag. Leave the ice on for 20 minutes, 2-3 times a day. Remove the ice if your skin turns bright red. This is very important. If you cannot feel pain, heat, or cold, you have a greater risk of damage to the area. If directed, apply heat to the affected area as often as told by your health care provider. Use the heat source that your health care provider recommends, such as a moist heat pack or a heating pad. Place a towel between your skin and the heat source. Leave the heat on for 20-30 minutes. Remove the heat if your skin turns bright red. This is especially important if you are unable to feel pain, heat, or cold. You may have a greater risk of getting burned. General instructions Rest the affected area as told by your health care provider. Avoid activities that make pain worse. Use splints, braces, pads, elastic wrap, or walking aids as told by your health care provider. Keep all follow-up visits. This is important. Preventing future episodes Wear knee pads if you kneel often. Wear sturdy running or walking shoes that fit well. Take breaks regularly from repetitive activity. Warm up by stretching before doing any activity that takes a lot of effort. Maintain a healthy weight or lose weight as recommended by your health care provider. If you need help doing this, ask your health care provider. Exercise regularly. Start any new physical activity gradually. Work with your physical or occupational therapist and your health care provider to help determine what caused the bursitis. Contact a health care provider if: You have a fever or chills. Your symptoms do not get better with treatment. You have pain or swelling that gets worse, or it goes away and then comes back. You have pus draining from the affected area. You have redness around the affected  area. The affected area is warm to the touch. Summary Bursitis is inflammation and irritation of a bursa, which is a small fluid-filled sac that cushions and protects the joints. Rest the affected area as told by your health care provider. Avoid activities that make pain worse. This condition can usually be treated at home with rest, ice, applying pressure (compression), and raising the body part that is affected (elevation). This is called RICE therapy. This information is not intended to replace advice given to you by your health care provider. Make sure you discuss any questions you have with your health care provider. Document Revised: 02/19/2021 Document Reviewed: 02/19/2021 Elsevier Patient Education  [place].

## 2022-05-19 NOTE — Progress Notes (Signed)
Established Patient Office Visit  Subjective   Patient ID: Brittany Savage, female    DOB: 1970-07-26  Age: 52 y.o. MRN: [id number]  Chief Complaint  Patient presents with   Follow-up    HPI  Patient is here for follow up for her chronic medical conditions. Patient would like to discuss anterior swelling of left knee since she fell on it about 5 weeks ago. Has been using ice/heat and pain has improved but still swollen.  Invasive Ductal Carcinoma, estrogen receptor positive:  -Was diagnosed in 2022 -s/p partial mastectomy with lymph node dissection 1/23 -S/p radiation in 3/23 -Currently on Tamoxifen and following with Surgical Oncology, Radiation Oncology and medical Cncology  and at [facility] -Also taking Bodfish OTC for night sweats, symptoms controlled -Seeing medical Oncology and mammogram in April   Anxiety: -Moods: Stable -Current Treatments: Zoloft 100 mg -Patient is compliant with the above medications at above dose and reports no side effects.     05/19/2022    3:41 PM 05/17/2021    1:54 PM 04/11/2020    8:47 AM 06/23/2019    9:17 AM 03/02/2019    9:35 AM  Depression screen PHQ 2/9  Decreased Interest 0 0 0 0 0  Down, Depressed, Hopeless 0 0 0 0 0  PHQ - 2 Score 0 0 0 0 0  Altered sleeping 0 0 0 0 0  Tired, decreased energy 0 0 0 0 0  Change in appetite 0 0 0 0 0  Feeling bad or failure about yourself  0 0 0 0 0  Trouble concentrating 0 0 0 0 0  Moving slowly or fidgety/restless 0 0 0 0 0  Suicidal thoughts 0 0 0 0 0  PHQ-9 Score 0 0 0 0 0  Difficult doing work/chores Not difficult at all Not difficult at all Not difficult at all Not difficult at all Not difficult at all   Allergic Rhinitis: -Currently on Flonase, controlling allergy symptoms well.    Health Maintenance: -Blood work due  -[place] 8/23, repeat in April scheduled -Colonoscopy 12/22, repeat in 7 years    Review of Systems  All other systems reviewed and are negative.     Objective:      BP 116/60   Pulse 100   Temp 98 F (36.7 C)   Resp 16   Ht '5\' 10"'$  (1.778 m)   Wt 155 lb 8 oz (70.5 kg)   SpO2 98%   BMI 22.31 kg/m  BP Readings from Last 3 Encounters:  05/19/22 116/60  03/07/21 104/64  09/19/20 122/82   Wt Readings from Last 3 Encounters:  05/19/22 155 lb 8 oz (70.5 kg)  03/07/21 143 lb (64.9 kg)  09/19/20 169 lb 9.6 oz (76.9 kg)      Physical Exam Constitutional:      Appearance: Normal appearance.  HENT:     Head: Normocephalic and atraumatic.  Eyes:     Conjunctiva/sclera: Conjunctivae normal.  Cardiovascular:     Rate and Rhythm: Normal rate and regular rhythm.  Pulmonary:     Effort: Pulmonary effort is normal.     Breath sounds: Normal breath sounds.  Musculoskeletal:        General: Swelling present. No tenderness.     Left knee: Swelling present. No erythema or crepitus. Normal range of motion. No tenderness.  Skin:    General: Skin is warm and dry.  Neurological:     General: No focal deficit present.     Mental Status:  She is alert. Mental status is at baseline.  Psychiatric:        Mood and Affect: Mood normal.        Behavior: Behavior normal.      No results found for any visits on 05/19/22.  Last CBC Lab Results  Component Value Date   WBC 6.2 06/23/2019   HGB 13.7 06/23/2019   HCT 42.6 06/23/2019   MCV 98.4 06/23/2019   MCH 31.6 06/23/2019   RDW 12.4 06/23/2019   PLT 261 [id number]   Last metabolic panel Lab Results  Component Value Date   GLUCOSE 102 (H) 06/23/2019   NA 141 06/23/2019   K 4.5 06/24/2019   CL 105 06/23/2019   CO2 27 06/23/2019   BUN 20 06/23/2019   CREATININE 0.92 06/23/2019   GFRNONAA 73 06/23/2019   CALCIUM 9.9 06/23/2019   PROT 7.3 06/23/2019   ALBUMIN 4.6 05/22/2016   LABGLOB 2.4 05/22/2016   AGRATIO 1.9 05/22/2016   BILITOT 0.4 06/23/2019   ALKPHOS 52 05/22/2016   AST 26 06/23/2019   ALT 21 06/23/2019   Last lipids Lab Results  Component Value Date   CHOL 180 06/23/2019    HDL 63 06/23/2019   LDLCALC 98 06/23/2019   TRIG 101 06/23/2019   CHOLHDL 2.9 06/23/2019   Last hemoglobin A1c No results found for: "HGBA1C" Last thyroid functions Lab Results  Component Value Date   TSH 0.93 11/26/2017   Last vitamin D No results found for: "25OHVITD2", "25OHVITD3", "VD25OH" Last vitamin B12 and Folate No results found for: "VITAMINB12", "FOLATE"    The 10-year ASCVD risk score (Arnett DK, et al., 2019) is: 1%    Assessment & Plan:   1. Adjustment disorder with anxious mood: Moods stable, continue Zoloft 100 mg, refilled. Labs due.   - CBC w/Diff/Platelet - COMPLETE METABOLIC PANEL WITH GFR - sertraline (ZOLOFT) 100 MG tablet; TAKE 1 TABLET BY MOUTH DAILY  Dispense: 90 tablet; Refill: 3  2. Allergic rhinitis, unspecified seasonality, unspecified trigger: Stable, refill Flonase.   - fluticasone (FLONASE) 50 MCG/ACT nasal spray; Place 2 sprays into both nostrils daily.  Dispense: 16 g; Refill: 6  3. Infiltrating ductal carcinoma of breast, unspecified laterality [facility]): Specialist notes reviewed, medical Oncology note reviewed from 10/04/21. Currently on Tamoxifen 20 mg and tolerating well. Seeing Oncology again in April and planning for mammogram then as well.   4. Pain and swelling of left knee: Appears to be pre-patellar bursitis, not inflamed currently. Will send for x-ray to be sure as swelling has been going on 5-6 weeks. Continue conservative treatments with anti-inflammatories, ice/heat.   - DG Knee Complete 4 Views Left; Future  5. Lipid screening: Screening labs due.   - Lipid Profile  Return in about 1 year (around 05/19/2023).    Teodora Medici, DO

## 2022-06-26 DIAGNOSIS — D2261 Melanocytic nevi of right upper limb, including shoulder: Secondary | ICD-10-CM | POA: Diagnosis not present

## 2022-06-26 DIAGNOSIS — L565 Disseminated superficial actinic porokeratosis (DSAP): Secondary | ICD-10-CM | POA: Diagnosis not present

## 2022-06-26 DIAGNOSIS — D2262 Melanocytic nevi of left upper limb, including shoulder: Secondary | ICD-10-CM | POA: Diagnosis not present

## 2022-06-26 DIAGNOSIS — D2272 Melanocytic nevi of left lower limb, including hip: Secondary | ICD-10-CM | POA: Diagnosis not present

## 2022-06-26 DIAGNOSIS — D225 Melanocytic nevi of trunk: Secondary | ICD-10-CM | POA: Diagnosis not present

## 2022-06-30 DIAGNOSIS — Z170 Estrogen receptor positive status [ER+]: Secondary | ICD-10-CM | POA: Diagnosis not present

## 2022-06-30 DIAGNOSIS — C50812 Malignant neoplasm of overlapping sites of left female breast: Secondary | ICD-10-CM | POA: Diagnosis not present

## 2022-06-30 LAB — HM MAMMOGRAPHY

## 2022-07-02 DIAGNOSIS — K5900 Constipation, unspecified: Secondary | ICD-10-CM | POA: Diagnosis not present

## 2022-07-02 DIAGNOSIS — N959 Unspecified menopausal and perimenopausal disorder: Secondary | ICD-10-CM | POA: Diagnosis not present

## 2022-07-02 DIAGNOSIS — E611 Iron deficiency: Secondary | ICD-10-CM | POA: Diagnosis not present

## 2022-07-02 DIAGNOSIS — Z1329 Encounter for screening for other suspected endocrine disorder: Secondary | ICD-10-CM | POA: Diagnosis not present

## 2022-07-04 DIAGNOSIS — Z170 Estrogen receptor positive status [ER+]: Secondary | ICD-10-CM | POA: Diagnosis not present

## 2022-07-04 DIAGNOSIS — C50812 Malignant neoplasm of overlapping sites of left female breast: Secondary | ICD-10-CM | POA: Diagnosis not present

## 2022-07-04 DIAGNOSIS — Z882 Allergy status to sulfonamides status: Secondary | ICD-10-CM | POA: Diagnosis not present

## 2022-07-04 DIAGNOSIS — F411 Generalized anxiety disorder: Secondary | ICD-10-CM | POA: Diagnosis not present

## 2022-07-04 DIAGNOSIS — Z880 Allergy status to penicillin: Secondary | ICD-10-CM | POA: Diagnosis not present

## 2022-07-04 DIAGNOSIS — I890 Lymphedema, not elsewhere classified: Secondary | ICD-10-CM | POA: Diagnosis not present

## 2022-07-10 DIAGNOSIS — Z79810 Long term (current) use of selective estrogen receptor modulators (SERMs): Secondary | ICD-10-CM | POA: Diagnosis not present

## 2022-07-10 DIAGNOSIS — C50912 Malignant neoplasm of unspecified site of left female breast: Secondary | ICD-10-CM | POA: Diagnosis not present

## 2022-07-10 DIAGNOSIS — Z170 Estrogen receptor positive status [ER+]: Secondary | ICD-10-CM | POA: Diagnosis not present

## 2022-07-10 DIAGNOSIS — F419 Anxiety disorder, unspecified: Secondary | ICD-10-CM | POA: Diagnosis not present

## 2022-07-10 DIAGNOSIS — Z9012 Acquired absence of left breast and nipple: Secondary | ICD-10-CM | POA: Diagnosis not present

## 2022-07-10 DIAGNOSIS — I890 Lymphedema, not elsewhere classified: Secondary | ICD-10-CM | POA: Diagnosis not present

## 2022-07-30 DIAGNOSIS — E611 Iron deficiency: Secondary | ICD-10-CM | POA: Diagnosis not present

## 2022-07-30 DIAGNOSIS — Z1329 Encounter for screening for other suspected endocrine disorder: Secondary | ICD-10-CM | POA: Diagnosis not present

## 2022-07-30 DIAGNOSIS — N959 Unspecified menopausal and perimenopausal disorder: Secondary | ICD-10-CM | POA: Diagnosis not present

## 2022-07-30 DIAGNOSIS — K5900 Constipation, unspecified: Secondary | ICD-10-CM | POA: Diagnosis not present

## 2022-09-29 ENCOUNTER — Other Ambulatory Visit: Payer: Self-pay

## 2022-09-29 DIAGNOSIS — F4322 Adjustment disorder with anxiety: Secondary | ICD-10-CM

## 2022-09-29 MED ORDER — SERTRALINE HCL 100 MG PO TABS: ORAL_TABLET | ORAL | 3 refills | Status: DC

## 2022-10-08 ENCOUNTER — Other Ambulatory Visit: Payer: Self-pay | Admitting: Internal Medicine

## 2022-10-08 ENCOUNTER — Telehealth: Payer: Self-pay | Admitting: Internal Medicine

## 2022-10-08 DIAGNOSIS — F4322 Adjustment disorder with anxiety: Secondary | ICD-10-CM

## 2022-10-08 MED ORDER — SERTRALINE HCL 100 MG PO TABS: ORAL_TABLET | ORAL | 3 refills | Status: DC

## 2022-10-08 NOTE — Telephone Encounter (Signed)
No answer from pt left detailed vm. °

## 2022-10-08 NOTE — Telephone Encounter (Signed)
Pt wants her Sertraline transferred to another pharmacy instead of [facility], she wants it sent to:  [facility] - [place], [place] - [address] Portal to Registered [employer] Sites  [address] [place] [address]  Phone: [phone] Fax: [phone]   sertraline (ZOLOFT) 100 MG tablet  She never picked it up from [facility].

## 2023-05-05 ENCOUNTER — Ambulatory Visit: Payer: PRIVATE HEALTH INSURANCE | Admitting: Internal Medicine

## 2023-05-12 ENCOUNTER — Ambulatory Visit (INDEPENDENT_AMBULATORY_CARE_PROVIDER_SITE_OTHER): Payer: Self-pay | Admitting: Internal Medicine

## 2023-05-12 ENCOUNTER — Encounter: Payer: Self-pay | Admitting: Internal Medicine

## 2023-05-12 ENCOUNTER — Other Ambulatory Visit: Payer: Self-pay

## 2023-05-12 VITALS — BP 110/80 | HR 73 | Temp 98.5°F | Resp 16 | Ht 70.0 in | Wt 155.5 lb

## 2023-05-12 DIAGNOSIS — Z13220 Encounter for screening for lipoid disorders: Secondary | ICD-10-CM

## 2023-05-12 DIAGNOSIS — Z114 Encounter for screening for human immunodeficiency virus [HIV]: Secondary | ICD-10-CM

## 2023-05-12 DIAGNOSIS — J309 Allergic rhinitis, unspecified: Secondary | ICD-10-CM

## 2023-05-12 DIAGNOSIS — Z1159 Encounter for screening for other viral diseases: Secondary | ICD-10-CM

## 2023-05-12 DIAGNOSIS — Z0001 Encounter for general adult medical examination with abnormal findings: Secondary | ICD-10-CM

## 2023-05-12 DIAGNOSIS — Z0000 Encounter for general adult medical examination without abnormal findings: Secondary | ICD-10-CM

## 2023-05-12 MED ORDER — FLUTICASONE PROPIONATE 50 MCG/ACT NA SUSP: 2.0000 | Freq: Every day | NASAL | 6 refills | Status: AC

## 2023-05-12 NOTE — Progress Notes (Signed)
 Name: Brittany Savage   MRN: [id number]    DOB: 25-Sep-1970   Date:05/12/2023       Progress Note  Subjective  Chief Complaint  Chief Complaint  Patient presents with   Annual Exam    HPI  Patient presents for annual CPE.  Diet: Regular, trying to be well balanaced  Exercise: 4 days a week 45 minutes - weights and cardio work  Last Eye Exam: completed Last Dental Exam: completed  Flowsheet Row Office Visit from 05/12/2023 in [facility]  AUDIT-C Score 1      Depression: Phq 9 is  negative    05/12/2023    8:03 AM 05/19/2022    3:41 PM 05/17/2021    1:54 PM 04/11/2020    8:47 AM 06/23/2019    9:17 AM  Depression screen PHQ 2/9  Decreased Interest 0 0 0 0 0  Down, Depressed, Hopeless 0 0 0 0 0  PHQ - 2 Score 0 0 0 0 0  Altered sleeping  0 0 0 0  Tired, decreased energy  0 0 0 0  Change in appetite  0 0 0 0  Feeling bad or failure about yourself   0 0 0 0  Trouble concentrating  0 0 0 0  Moving slowly or fidgety/restless  0 0 0 0  Suicidal thoughts  0 0 0 0  PHQ-9 Score  0 0 0 0  Difficult doing work/chores  Not difficult at all Not difficult at all Not difficult at all Not difficult at all   Hypertension: BP Readings from Last 3 Encounters:  05/12/23 110/80  05/19/22 116/60  03/07/21 104/64   Obesity: Wt Readings from Last 3 Encounters:  05/12/23 155 lb 8 oz (70.5 kg)  05/19/22 155 lb 8 oz (70.5 kg)  03/07/21 143 lb (64.9 kg)   BMI Readings from Last 3 Encounters:  05/12/23 22.31 kg/m  05/19/22 22.31 kg/m  03/07/21 20.52 kg/m     Vaccines: vaccines reviewed with the patient.   Hep C Screening: getting today STD testing and prevention (HIV/chl/gon/syphilis): no concerns  Intimate partner violence: negative screen  Menstrual History/LMP/Abnormal Bleeding: No periods - had a uterine ablation years ago  Discussed importance of follow up if any post-menopausal bleeding: no  Incontinence Symptoms: starting to have more urgent  urination but no incontinence   Breast cancer:  - Last Mammogram: Hx of breast Cancer mammogram every 6 months at [facility], October 2024 (left side) - 6 months on the left and yearly bilateral. Going back next month for regular screening   Osteoporosis Prevention : Discussed high calcium and vitamin D supplementation, weight bearing exercises Bone density :not applicable   Cervical cancer screening: up-to-date 1/25 [facility] ASCUS, negative HPV  Skin cancer: Discussed monitoring for atypical lesions  Colorectal cancer:   colonoscopy 02/2021 - 2 tubular adenomas removed, return in 5-7 years  Lung cancer:  Low Dose CT Chest recommended if Age 85-80 years, 20 pack-year currently smoking OR have quit w/in 15years. Patient does not qualify for screen    Advanced Care Planning: A voluntary discussion about advance care planning including the explanation and discussion of advance directives.  Discussed health care proxy and Living will, and the patient was unable to identify a health care proxy.  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 ENDOSCOPY;  Service: Gastroenterology;  Laterality: N/A;   DILATION AND CURETTAGE OF UTERUS  2006   NASAL SINUS SURGERY  2011    Family History  Problem  Relation Age of Onset   Breast cancer Mother    Heart disease Father    Colon cancer Maternal Grandfather     Social History   Socioeconomic History   Marital status: Married    Spouse name: Not on file   Number of children: Not on file   Years of education: Not on file   Highest education level: Not on file  Occupational History   Not on file  Tobacco Use   Smoking status: Never   Smokeless tobacco: Never  Vaping Use   Vaping status: Never Used  Substance and Sexual Activity   Alcohol use: Yes    Alcohol/week: 21.0 standard drinks of alcohol    Types: 21 Glasses of wine per week    Comment: daily   Drug use: No   Sexual activity: Yes    Birth control/protection: None  Other Topics Concern   Not on file  Social History Narrative   Not on file   Social Drivers of Health   Financial Resource Strain: Low Risk  (05/12/2023)   Overall Financial Resource Strain (CARDIA)    Difficulty of Paying Living Expenses: Not hard at all  Food Insecurity: No Food Insecurity (05/12/2023)   Hunger Vital Sign    Worried About Running Out of Food in the Last Year: Never true    Ran Out of Food in the Last Year: Never true  Transportation Needs: No Transportation Needs (05/12/2023)   PRAPARE - Administrator, Civil Service (Medical): No    Lack of Transportation (Non-Medical): No  Physical Activity: Sufficiently Active (05/12/2023)   Exercise Vital Sign    Days of Exercise per Week: 4 days    Minutes of Exercise per Session: 50 min  Stress: No Stress Concern Present (05/12/2023)   [employer] of Occupational Health - Occupational Stress Questionnaire    Feeling of Stress : Only a little  Social Connections: Moderately Isolated (05/12/2023)   Social Connection and Isolation Panel [NHANES]    Frequency of Communication with Friends and Family: More than three times a week    Frequency of Social Gatherings with Friends and Family: More than three times a week    Attends Religious  Services: Never    Database administrator or Organizations: No    Attends Banker Meetings: Never    Marital Status: Married  Catering manager Violence: Not At Risk (05/12/2023)   Humiliation, Afraid, Rape, and Kick questionnaire    Fear of Current or Ex-Partner: No    Emotionally Abused: No    Physically Abused: No    Sexually Abused: No     Current Outpatient Medications:    fluticasone (FLONASE) 50 MCG/ACT nasal spray, Place 2 sprays  into both nostrils daily., Disp: 16 g, Rfl: 6   Multiple Vitamin (MULTIVITAMIN) tablet, Take 1 tablet by mouth daily., Disp: , Rfl:    Omega-3 1000 MG CAPS, Take by mouth daily. , Disp: , Rfl:    Probiotic Product (PROBIOTIC DAILY PO), Take by mouth daily., Disp: , Rfl:    sertraline (ZOLOFT) 100 MG tablet, TAKE 1 TABLET BY MOUTH DAILY, Disp: 90 tablet, Rfl: 3  Allergies  Allergen Reactions   Nitrofurantoin    Penicillins    Sulfamethoxazole-Trimethoprim      Review of Systems  All other systems reviewed and are negative.    Objective  Vitals:   05/12/23 0806  BP: 110/80  Pulse: 73  Resp: 16  Temp: 98.5 F (36.9 C)  TempSrc: Oral  SpO2: 99%  Weight: 155 lb 8 oz (70.5 kg)  Height: 5\' 10"  (1.778 m)    Body mass index is 22.31 kg/m.  Physical Exam Constitutional:      Appearance: Normal appearance.  HENT:     Head: Normocephalic and atraumatic.     Mouth/Throat:     Mouth: Mucous membranes are moist.     Pharynx: Oropharynx is clear.  Eyes:     Extraocular Movements: Extraocular movements intact.     Conjunctiva/sclera: Conjunctivae normal.     Pupils: Pupils are equal, round, and reactive to light.  Neck:     Comments: No thyromegaly Cardiovascular:     Rate and Rhythm: Normal rate and regular rhythm.  Pulmonary:     Effort: Pulmonary effort is normal.     Breath sounds: Normal breath sounds.  Musculoskeletal:     Cervical back: No tenderness.     Right lower leg: No edema.     Left lower leg: No edema.   Lymphadenopathy:     Cervical: No cervical adenopathy.  Skin:    General: Skin is warm and dry.  Neurological:     General: No focal deficit present.     Mental Status: She is alert. Mental status is at baseline.  Psychiatric:        Mood and Affect: Mood normal.        Behavior: Behavior normal.     Last CBC Lab Results  Component Value Date   WBC 5.3 05/19/2022   HGB 12.3 05/19/2022   HCT 37.3 05/19/2022   MCV 97.1 05/19/2022   MCH 32.0 05/19/2022   RDW 11.7 05/19/2022   PLT 231 05/19/2022   Last metabolic panel Lab Results  Component Value Date   GLUCOSE 103 (H) 05/19/2022   NA 140 05/19/2022   K 4.1 05/19/2022   CL 106 05/19/2022   CO2 26 05/19/2022   BUN 10 05/19/2022   CREATININE 0.77 05/19/2022   EGFR 92 05/19/2022   CALCIUM 9.4 05/19/2022   PROT 6.8 05/19/2022   ALBUMIN 4.6 05/22/2016   LABGLOB 2.4 05/22/2016   AGRATIO 1.9 05/22/2016   BILITOT 0.6 05/19/2022   ALKPHOS 52 05/22/2016   AST 19 05/19/2022   ALT 16 05/19/2022   Last lipids Lab Results  Component Value Date   CHOL 154 05/19/2022   HDL 68 05/19/2022   LDLCALC 71 05/19/2022   TRIG 70 05/19/2022   CHOLHDL 2.3 05/19/2022   Last hemoglobin A1c No results found for: "HGBA1C" Last thyroid functions Lab Results  Component Value Date   TSH 0.93 11/26/2017   Last vitamin D No results found for: "25OHVITD2", "25OHVITD3", "VD25OH" Last vitamin B12 and Folate No results found for: "VITAMINB12", "  FOLATE"    Assessment & Plan  1. Annual physical exam (Primary)/Lipid screening/Need for hepatitis C screening test/Screening for HIV (human immunodeficiency virus): Physical exam completed, health maintenance reviewed and annual labs ordered.   - CBC w/Diff/Platelet - COMPLETE METABOLIC PANEL WITH GFR - Lipid Profile - Hepatitis C Antibody - HIV antibody (with reflex) - TSH  2. Allergic rhinitis, unspecified seasonality, unspecified trigger: Flonase refilled.   - fluticasone (FLONASE) 50  MCG/ACT nasal spray; Place 2 sprays into both nostrils daily.  Dispense: 16 g; Refill: 6   -USPSTF grade A and B recommendations reviewed with patient; age-appropriate recommendations, preventive care, screening tests, etc discussed and encouraged; healthy living encouraged; see AVS for patient education given to patient -Discussed importance of 150 minutes of physical activity weekly, eat two servings of fish weekly, eat one serving of tree nuts ( cashews, pistachios, pecans, almonds.Marland Kitchen) every other day, eat 6 servings of fruit/vegetables daily and drink plenty of water and avoid sweet beverages.   -Reviewed Health Maintenance: Yes.

## 2023-05-13 LAB — COMPLETE METABOLIC PANEL WITH GFR
AG Ratio: 1.9 (calc) (ref 1.0–2.5)
ALT: 13 U/L (ref 6–29)
AST: 19 U/L (ref 10–35)
Albumin: 4.2 g/dL (ref 3.6–5.1)
Alkaline phosphatase (APISO): 31 U/L — ABNORMAL LOW (ref 37–153)
BUN: 10 mg/dL (ref 7–25)
CO2: 23 mmol/L (ref 20–32)
Calcium: 9.1 mg/dL (ref 8.6–10.4)
Chloride: 107 mmol/L (ref 98–110)
Creat: 0.67 mg/dL (ref 0.50–1.03)
Globulin: 2.2 g/dL (ref 1.9–3.7)
Glucose, Bld: 95 mg/dL (ref 65–99)
Potassium: 4 mmol/L (ref 3.5–5.3)
Sodium: 140 mmol/L (ref 135–146)
Total Bilirubin: 0.5 mg/dL (ref 0.2–1.2)
Total Protein: 6.4 g/dL (ref 6.1–8.1)
eGFR: 104 mL/min/{1.73_m2} (ref >=60)

## 2023-05-13 LAB — CBC WITH DIFFERENTIAL/PLATELET
Absolute Lymphocytes: 1450 {cells}/uL (ref 850–3900)
Absolute Monocytes: 319 {cells}/uL (ref 200–950)
Basophils Absolute: 39 {cells}/uL (ref 0–200)
Basophils Relative: 0.8 %
Eosinophils Absolute: 108 {cells}/uL (ref 15–500)
Eosinophils Relative: 2.2 %
HCT: 39 % (ref 35.0–45.0)
Hemoglobin: 12.2 g/dL (ref 11.7–15.5)
MCH: 30.8 pg (ref 27.0–33.0)
MCHC: 31.3 g/dL — ABNORMAL LOW (ref 32.0–36.0)
MCV: 98.5 fL (ref 80.0–100.0)
MPV: 11.5 fL (ref 7.5–12.5)
Monocytes Relative: 6.5 %
Neutro Abs: 2984 {cells}/uL (ref 1500–7800)
Neutrophils Relative %: 60.9 %
Platelets: 238 10*3/uL (ref 140–400)
RBC: 3.96 10*6/uL (ref 3.80–5.10)
RDW: 11.8 % (ref 11.0–15.0)
Total Lymphocyte: 29.6 %
WBC: 4.9 10*3/uL (ref 3.8–10.8)

## 2023-05-13 LAB — LIPID PANEL
Cholesterol: 151 mg/dL (ref <200)
HDL: 62 mg/dL (ref >=50)
LDL Cholesterol (Calc): 71 mg/dL
Non-HDL Cholesterol (Calc): 89 mg/dL (ref <130)
Total CHOL/HDL Ratio: 2.4 (calc) (ref <5.0)
Triglycerides: 96 mg/dL (ref <150)

## 2023-05-13 LAB — HEPATITIS C ANTIBODY: Hepatitis C Ab: NONREACTIVE

## 2023-05-13 LAB — TSH: TSH: 1.46 m[IU]/L

## 2023-05-13 LAB — HIV ANTIBODY (ROUTINE TESTING W REFLEX): HIV 1&2 Ab, 4th Generation: NONREACTIVE

## 2023-05-21 ENCOUNTER — Ambulatory Visit: Payer: Self-pay | Admitting: Internal Medicine

## 2023-05-21 ENCOUNTER — Ambulatory Visit: Payer: BC Managed Care – PPO | Admitting: Family Medicine

## 2023-05-27 ENCOUNTER — Ambulatory Visit: Payer: PRIVATE HEALTH INSURANCE | Admitting: Internal Medicine

## 2023-06-28 IMAGING — US US BREAST BX W LOC DEV 1ST LESION IMG BX SPEC US GUIDE*L*
1 series · 12 of 12 positions shown · non-contrast
Comparison: Previous exam(s).
COMPARISON: Previous exam(s).

Addendum:
CLINICAL DATA: 50-year-old female presenting for ultrasound-guided
biopsy of a left breast mass.

EXAM:
ULTRASOUND GUIDED LEFT BREAST CORE NEEDLE BIOPSY

[Series 1: us breast bx w loc dev 1st lesion img bx spec us g · 0.05mm/px · 12 of 12 slices shown]
[im 1/12]
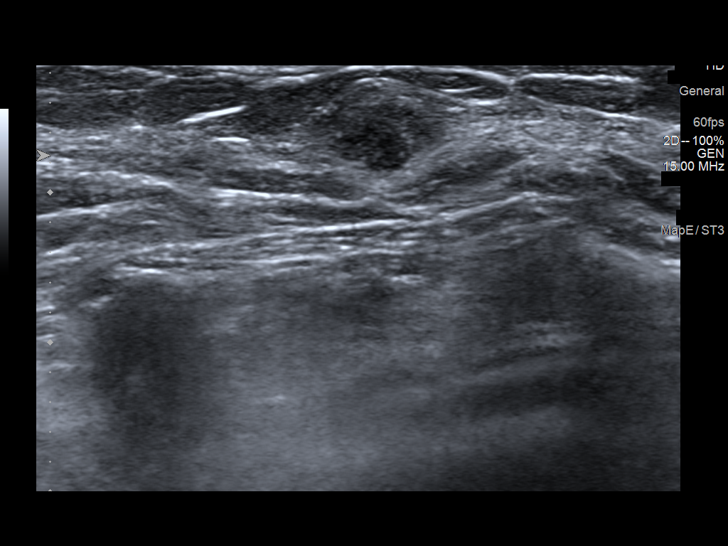
[im 2/12]
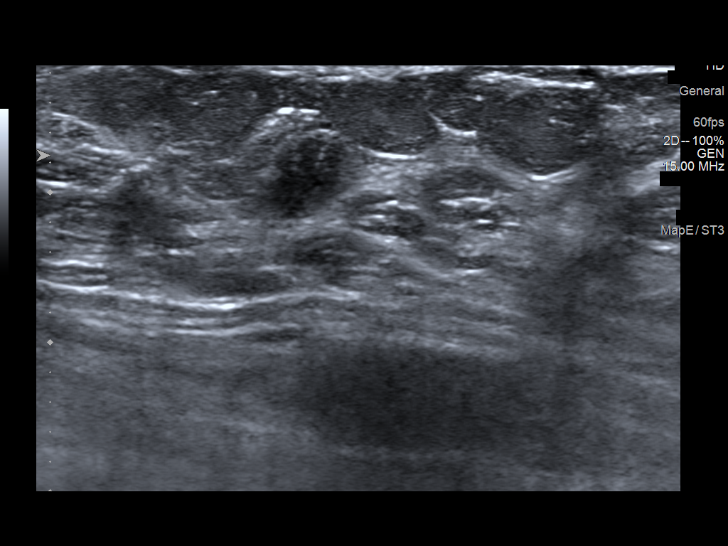
[im 3/12]
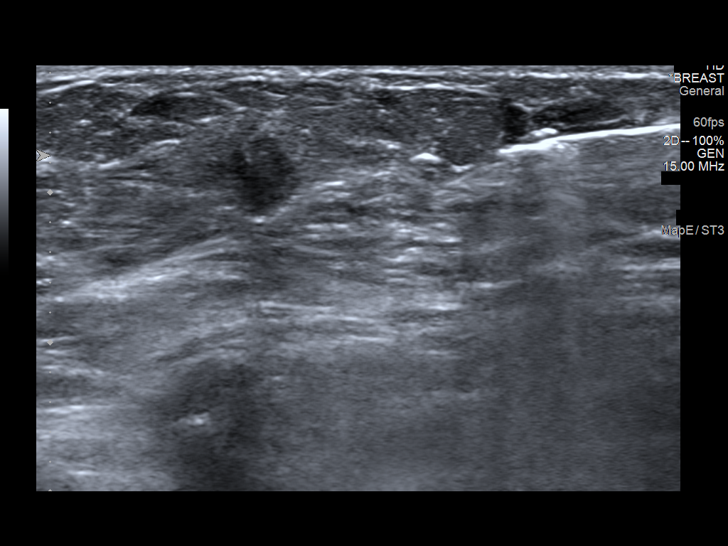
[im 4/12]
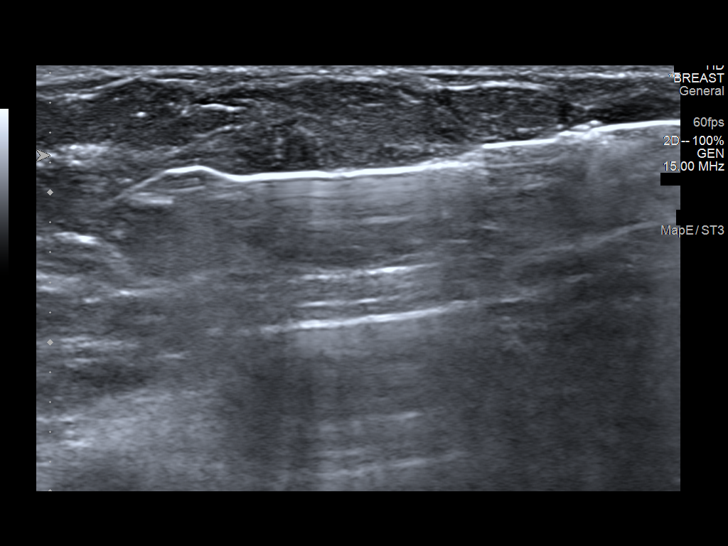
[im 5/12]
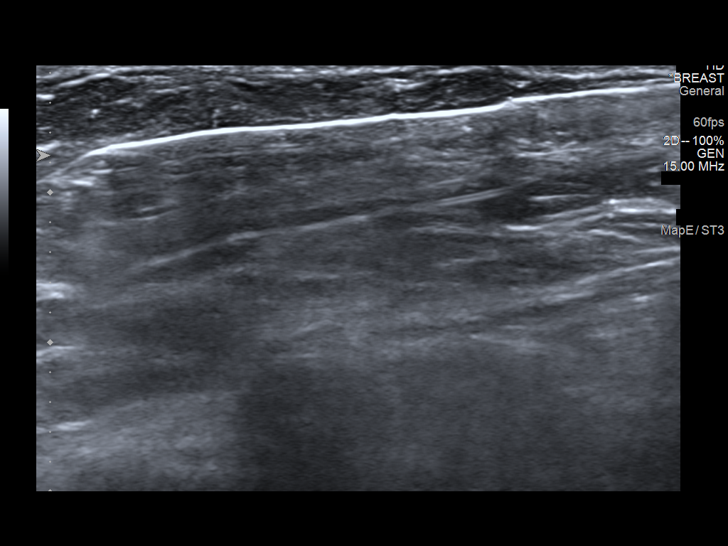
[im 6/12]
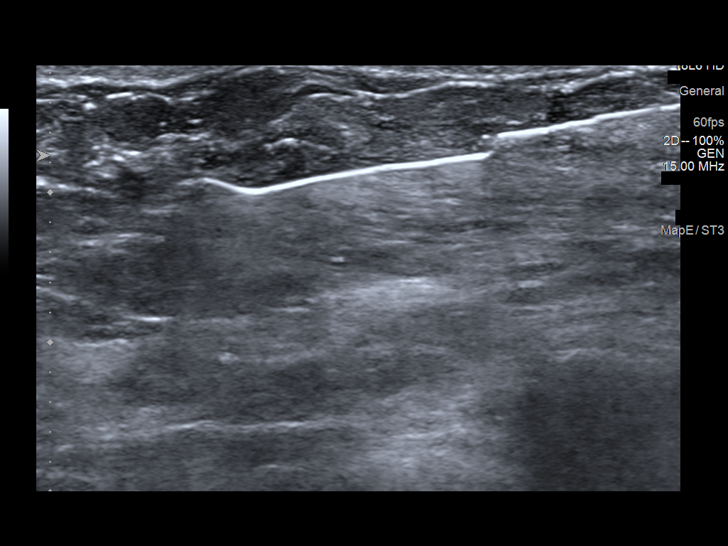
[im 7/12]
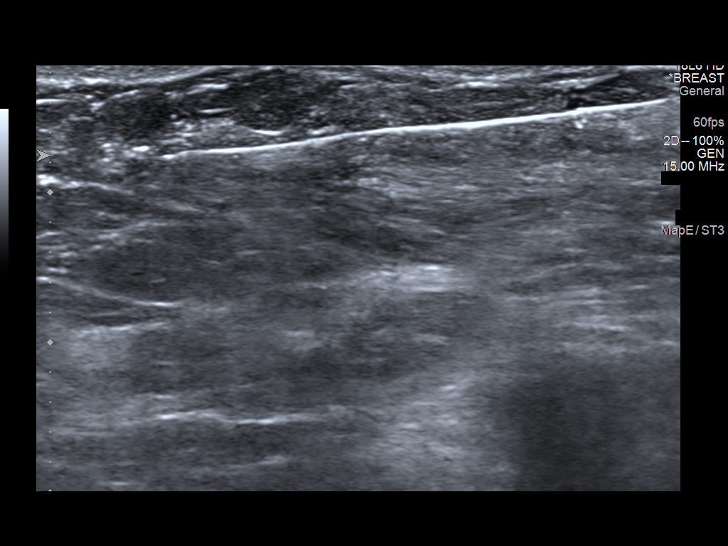
[im 8/12]
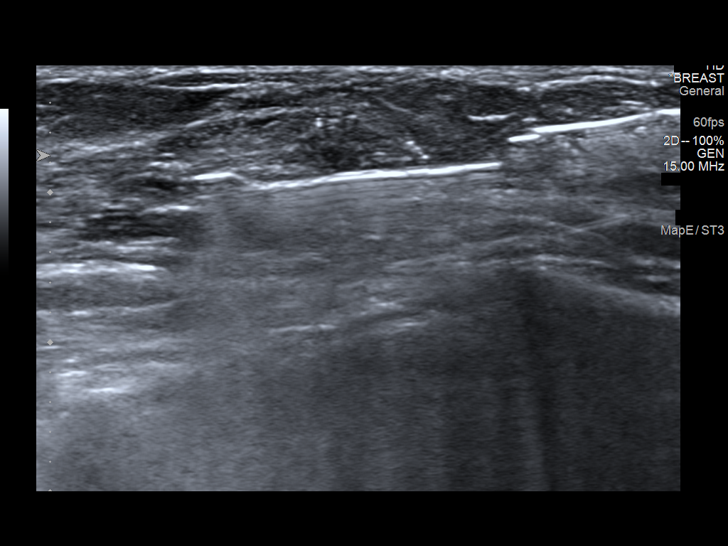
[im 9/12]
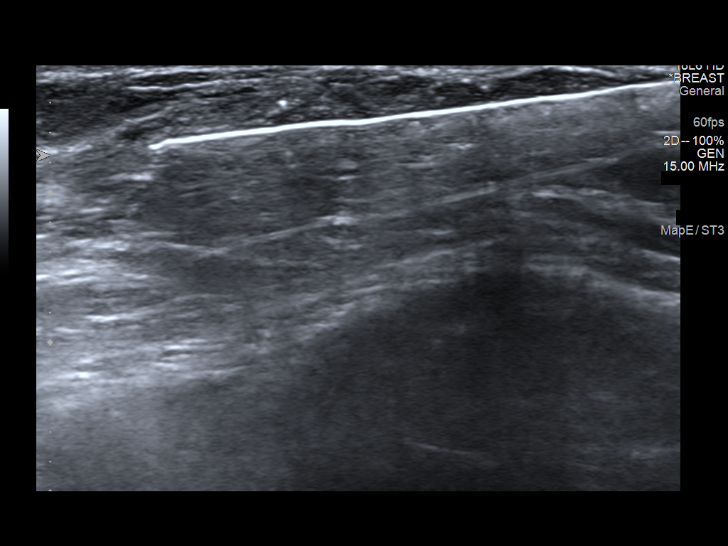
[im 10/12]
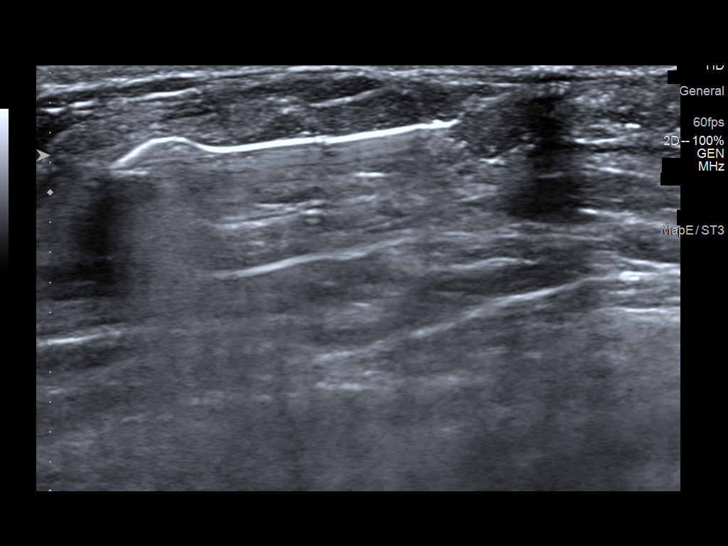
[im 11/12]
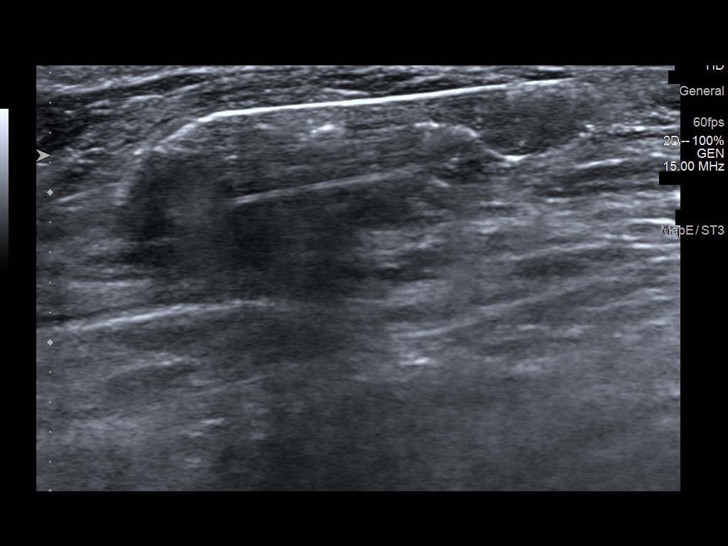
[im 12/12]
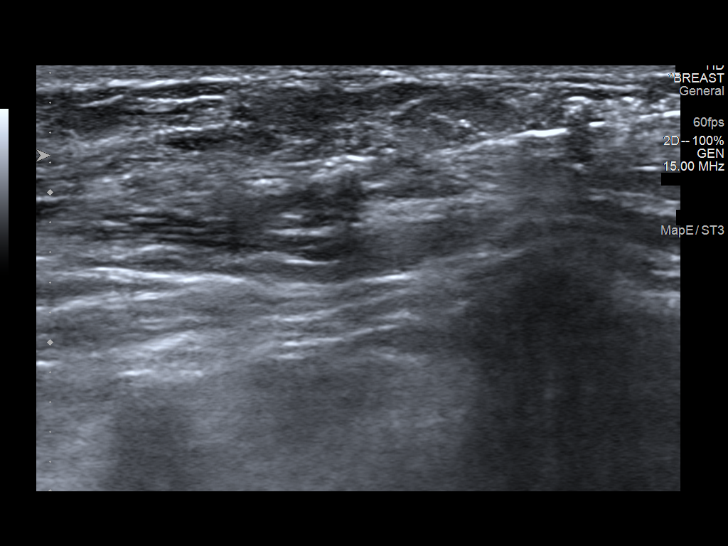

[12 of 12 positions shown; findings below may reference images not displayed]



Lesion quadrant: Lower outer quadrant

Using sterile technique and 1% Lidocaine as local anesthetic, under
direct ultrasound visualization, a 14 gauge Tiger device was
used to perform biopsy of a mass in the left breast at 6 o'clock, 3
cm from the nipple using a lateral approach. At the conclusion of
the procedure a ribbon shaped tissue marker clip was deployed into
the biopsy cavity. Follow up 2 view mammogram was performed and
dictated separately.
IMPRESSION: Ultrasound guided biopsy of a left breast mass at 6. No apparent
complications.

ADDENDUM:
Pathology revealed GRADE II INVASIVE DUCTAL CARCINOMA of the LEFT
breast, 6 o'clock (ribbon clip). This was found to be concordant by
Dr. Azhiim Akimichi.

Pathology results were discussed with the patient by telephone. The
patient reported doing well after the biopsy with tenderness at the
site. Post biopsy instructions and care were reviewed and questions
were answered. The patient was encouraged to call The [REDACTED]

Surgical consultation has been arranged with Dr. Michaelle Liz at
[REDACTED] on March 19, 2021.

Pathology results reported by Nomasibulele Moatshe RN on 03/13/2021.



Lesion quadrant: Lower outer quadrant

Using sterile technique and 1% Lidocaine as local anesthetic, under
direct ultrasound visualization, a 14 gauge Tiger device was
used to perform biopsy of a mass in the left breast at 6 o'clock, 3
cm from the nipple using a lateral approach. At the conclusion of
the procedure a ribbon shaped tissue marker clip was deployed into
the biopsy cavity. Follow up 2 view mammogram was performed and
dictated separately.
IMPRESSION: Ultrasound guided biopsy of a left breast mass at 6. No apparent
complications.

## 2023-06-28 IMAGING — MG MM BREAST LOCALIZATION CLIP
4 series · 4 of 12 positions shown · non-contrast
Comparison: Previous exam(s).

CLINICAL DATA: Post biopsy mammogram of the left breast for clip
placement.

EXAM:
3D DIAGNOSTIC LEFT MAMMOGRAM POST ULTRASOUND BIOPSY

[L CC synth-2D]
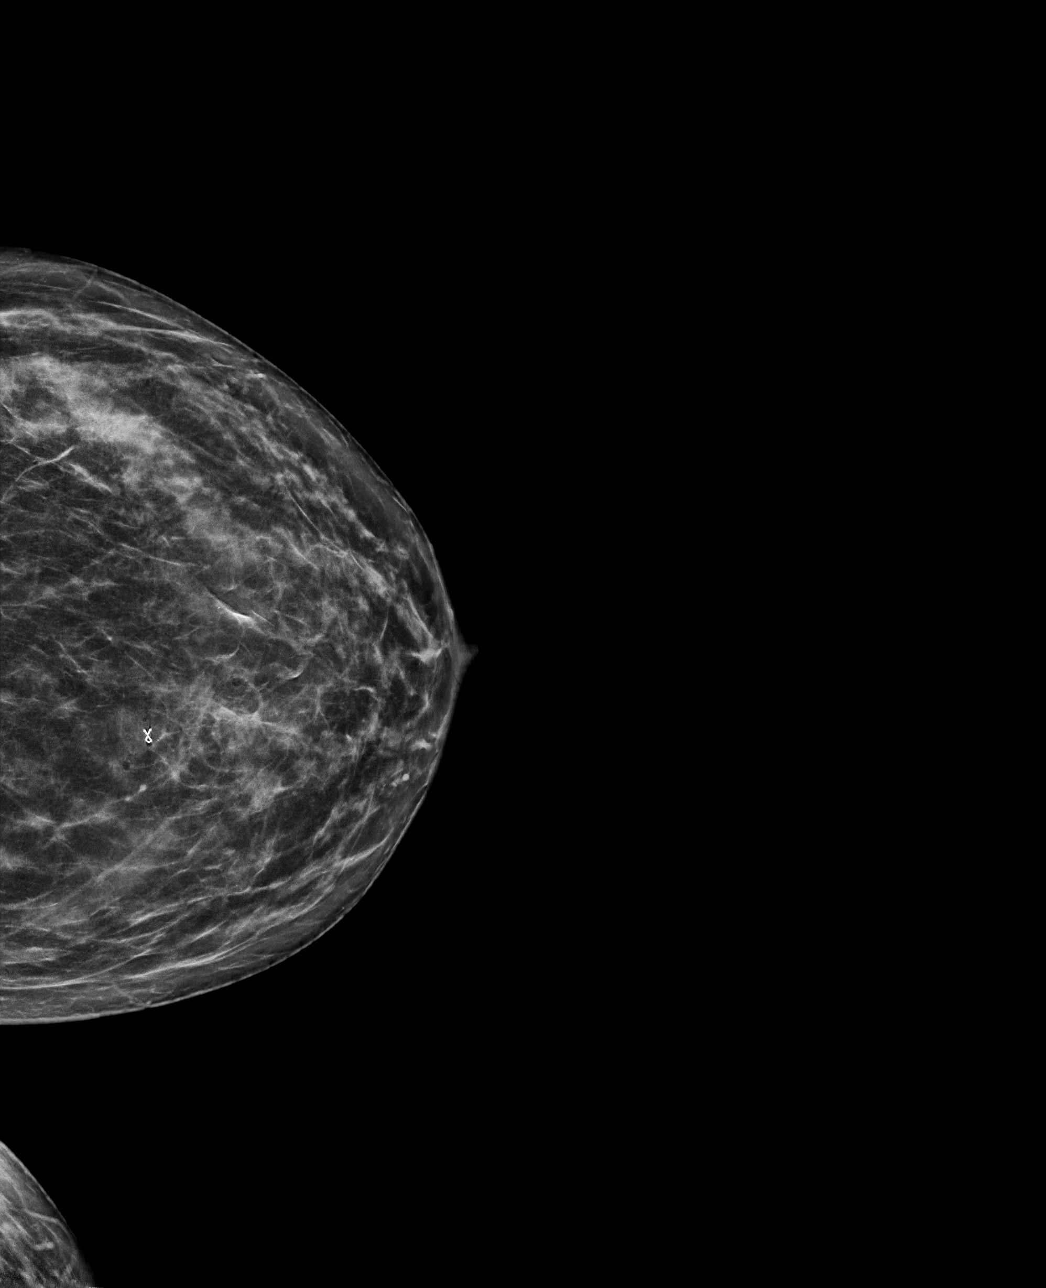

[L ML synth-2D]
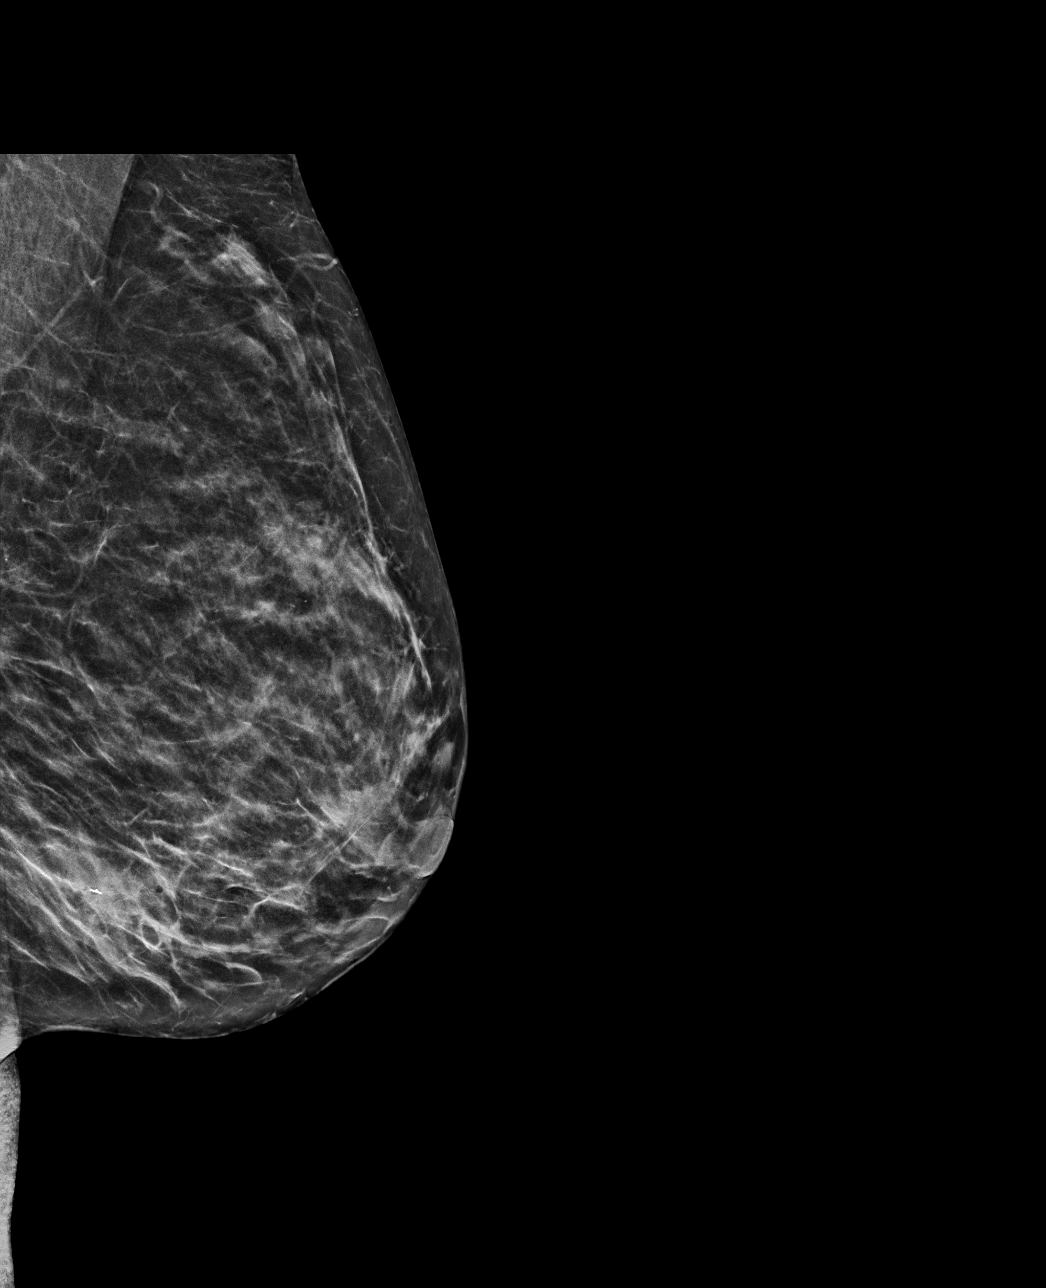

[L ML tomo · tomo slice 28/55.0]
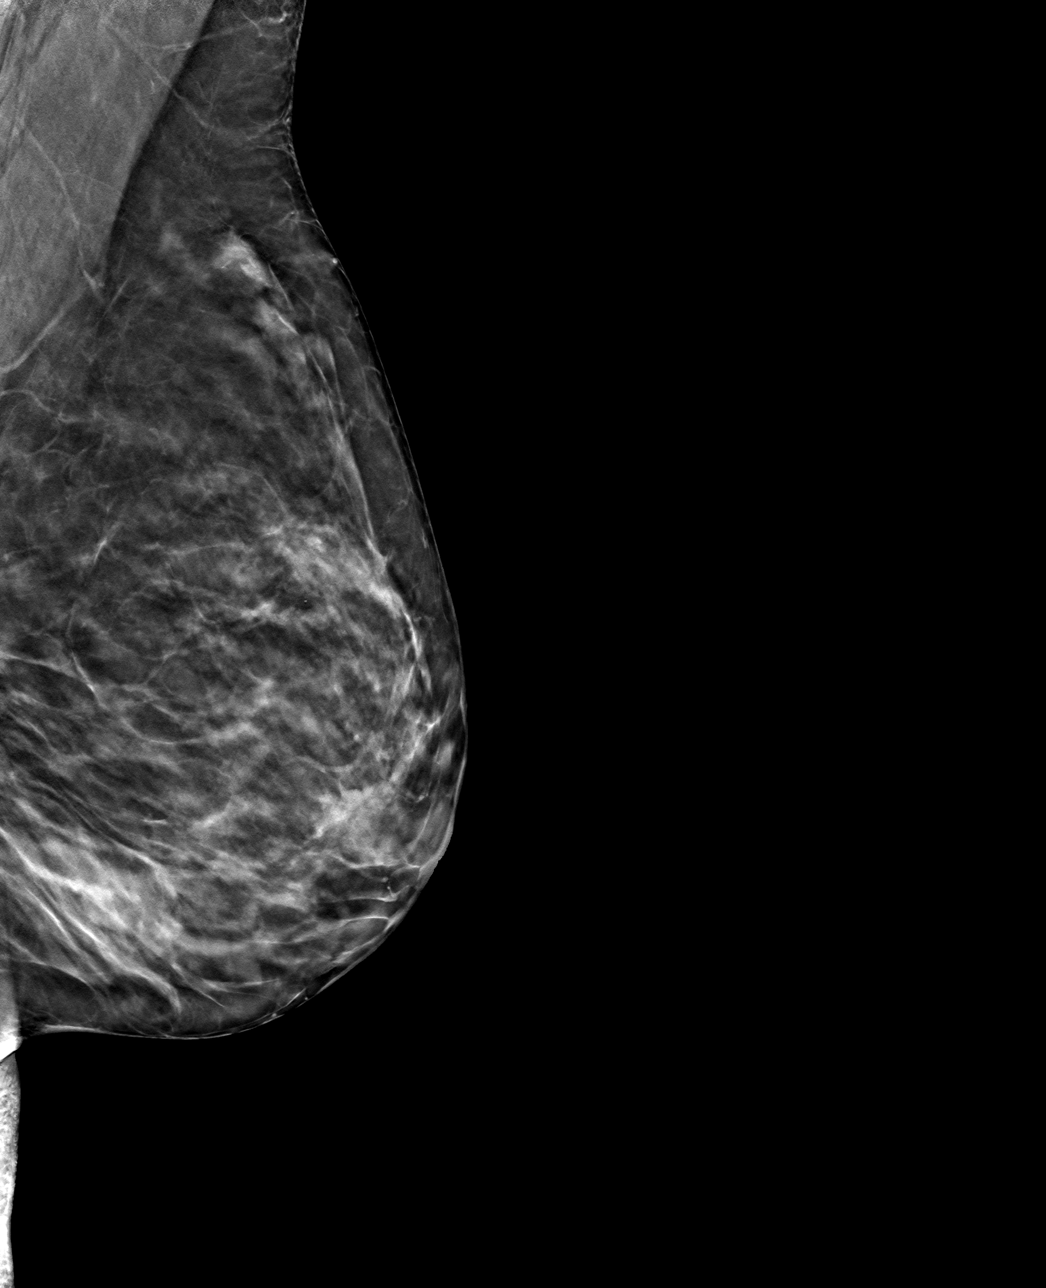

[L CC tomo · tomo slice 31/62.0]
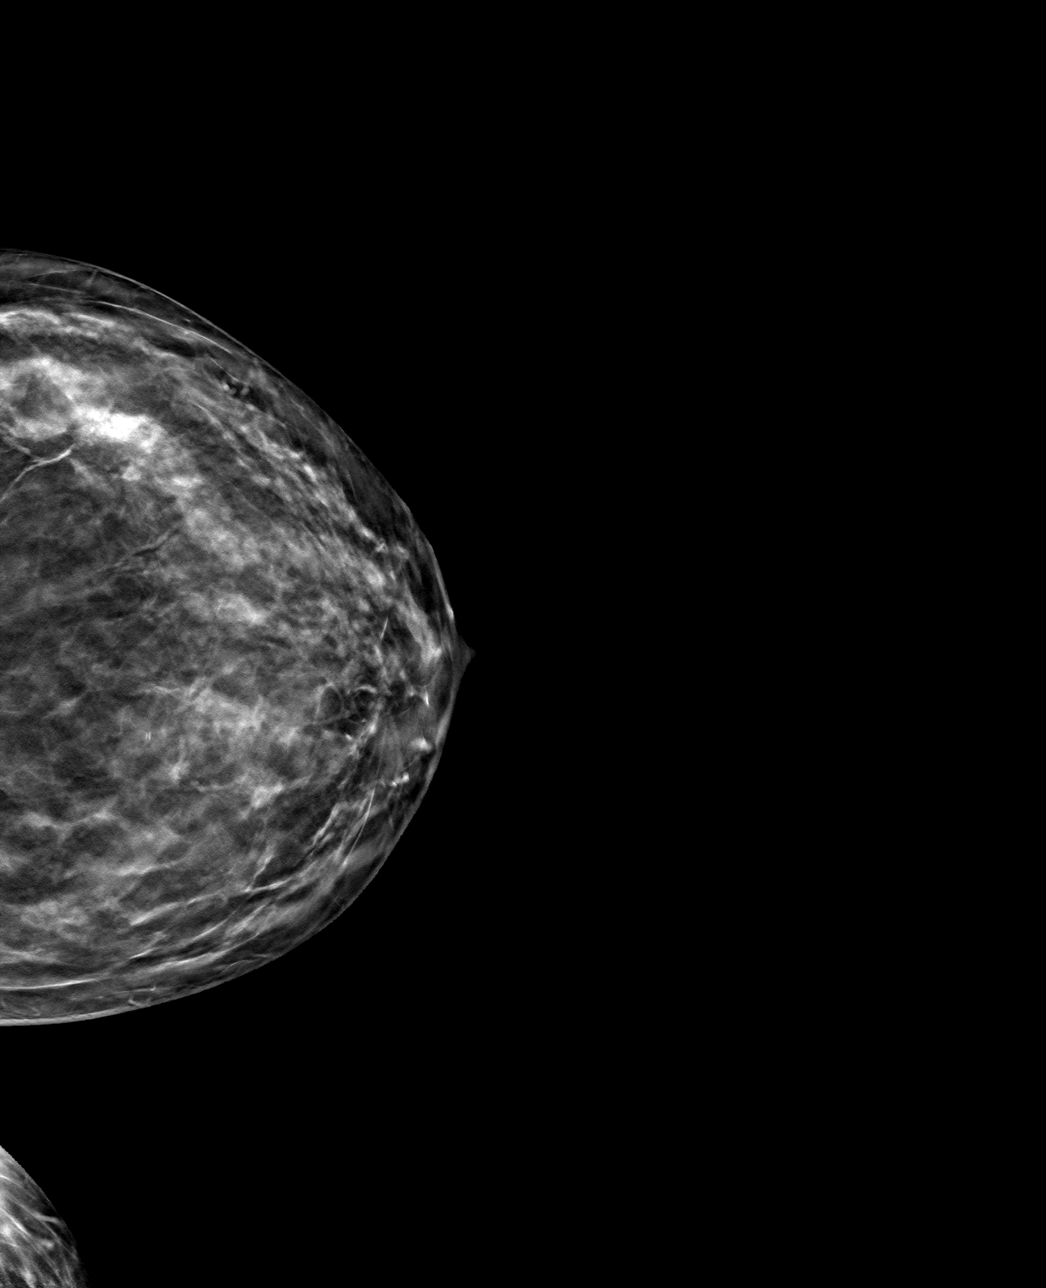

[4 of 12 positions shown; findings below may reference images not displayed]

FINDINGS: 3D Mammographic images were obtained following ultrasound guided
biopsy of a left breast mass at 6 o'clock. The biopsy marking clip
is in expected position at the site of biopsy.
IMPRESSION: Appropriate positioning of the ribbon shaped biopsy marking clip at
the site of biopsy in the inferior left breast.

Final Assessment: Post Procedure Mammograms for Marker Placement

## 2023-07-23 ENCOUNTER — Ambulatory Visit (INDEPENDENT_AMBULATORY_CARE_PROVIDER_SITE_OTHER): Payer: Self-pay | Admitting: Nurse Practitioner

## 2023-07-23 ENCOUNTER — Encounter: Payer: Self-pay | Admitting: Nurse Practitioner

## 2023-07-23 VITALS — BP 118/72 | HR 68 | Temp 98.4°F | Resp 18 | Ht 70.0 in | Wt 158.6 lb

## 2023-07-23 DIAGNOSIS — K6289 Other specified diseases of anus and rectum: Secondary | ICD-10-CM | POA: Diagnosis not present

## 2023-07-23 NOTE — Progress Notes (Signed)
 BP 118/72   Pulse 68   Temp 98.4 F (36.9 C)   Resp 18   Ht 5\' 10"  (1.778 m)   Wt 158 lb 9.6 oz (71.9 kg)   SpO2 99%   BMI 22.76 kg/m    Subjective:    Patient ID: Brittany Savage, female    DOB: Dec 17, 1970, 53 y.o.   MRN: [id number]  HPI: Brittany Savage is a 53 y.o. female  Chief Complaint  Patient presents with   Mass    Rectal mass    Discussed the use of AI scribe software for clinical note transcription with the patient, who gave verbal consent to proceed.  History of Present Illness Brittany Savage is a 53 year old female who presents with a palpable anal mass.  Approximately one week ago, she discovered a palpable, rolling anal mass while showering. The mass is non-tender unless manipulated. There is no associated bleeding, blood in stool, or constipation.  She underwent a colonoscopy in December 2022, which revealed some polyps. She does not recall the name of the gastroenterologist but believes the office was located nearby.  Her past medical history is significant for breast cancer.         05/12/2023    8:03 AM 05/19/2022    3:41 PM 05/17/2021    1:54 PM  Depression screen PHQ 2/9  Decreased Interest 0 0 0  Down, Depressed, Hopeless 0 0 0  PHQ - 2 Score 0 0 0  Altered sleeping  0 0  Tired, decreased energy  0 0  Change in appetite  0 0  Feeling bad or failure about yourself   0 0  Trouble concentrating  0 0  Moving slowly or fidgety/restless  0 0  Suicidal thoughts  0 0  PHQ-9 Score  0 0  Difficult doing work/chores  Not difficult at all Not difficult at all    Relevant past medical, surgical, family and social history reviewed and updated as indicated. Interim medical history since our last visit reviewed. Allergies and medications reviewed and updated.  Review of Systems  Ten systems reviewed and is negative except as mentioned in HPI       Objective:      BP 118/72   Pulse 68   Temp 98.4 F (36.9 C)   Resp 18   Ht 5\' 10"   (1.778 m)   Wt 158 lb 9.6 oz (71.9 kg)   SpO2 99%   BMI 22.76 kg/m    Wt Readings from Last 3 Encounters:  07/23/23 158 lb 9.6 oz (71.9 kg)  05/12/23 155 lb 8 oz (70.5 kg)  05/19/22 155 lb 8 oz (70.5 kg)    Physical Exam Vitals reviewed. Exam conducted with a chaperone present.  Constitutional:      Appearance: Normal appearance.  HENT:     Head: Normocephalic.  Cardiovascular:     Rate and Rhythm: Normal rate.  Pulmonary:     Effort: Pulmonary effort is normal.  Genitourinary:    Rectum: Mass present. No tenderness.  Neurological:     General: No focal deficit present.     Mental Status: She is alert and oriented to person, place, and time. Mental status is at baseline.  Psychiatric:        Mood and Affect: Mood normal.        Behavior: Behavior normal.        Thought Content: Thought content normal.        Judgment: Judgment  normal.    Physical Exam    Results for orders placed or performed in visit on 05/12/23  HM MAMMOGRAPHY   Collection Time: 06/30/22 12:00 AM  Result Value Ref Range   HM Mammogram 0-4 Bi-Rad 0-4 Bi-Rad, Self Reported Normal  CBC w/Diff/Platelet   Collection Time: 05/12/23  8:39 AM  Result Value Ref Range   WBC 4.9 3.8 - 10.8 Thousand/uL   RBC 3.96 3.80 - 5.10 Million/uL   Hemoglobin 12.2 11.7 - 15.5 g/dL   HCT 16.1 09.6 - 04.5 %   MCV 98.5 80.0 - 100.0 fL   MCH 30.8 27.0 - 33.0 pg   MCHC 31.3 (L) 32.0 - 36.0 g/dL   RDW 40.9 81.1 - 91.4 %   Platelets 238 140 - 400 Thousand/uL   MPV 11.5 7.5 - 12.5 fL   Neutro Abs 2,984 1,500 - 7,800 cells/uL   Absolute Lymphocytes 1,450 850 - 3,900 cells/uL   Absolute Monocytes 319 200 - 950 cells/uL   Eosinophils Absolute 108 15 - 500 cells/uL   Basophils Absolute 39 0 - 200 cells/uL   Neutrophils Relative % 60.9 %   Total Lymphocyte 29.6 %   Monocytes Relative 6.5 %   Eosinophils Relative 2.2 %   Basophils Relative 0.8 %  COMPLETE METABOLIC PANEL WITH GFR   Collection Time: 05/12/23  8:39 AM   Result Value Ref Range   Glucose, Bld 95 65 - 99 mg/dL   BUN 10 7 - 25 mg/dL   Creat 7.82 9.56 - 2.13 mg/dL   eGFR 086 > OR = 60 VH/QIO/9.62X5   BUN/Creatinine Ratio SEE NOTE: 6 - 22 (calc)   Sodium 140 135 - 146 mmol/L   Potassium 4.0 3.5 - 5.3 mmol/L   Chloride 107 98 - 110 mmol/L   CO2 23 20 - 32 mmol/L   Calcium 9.1 8.6 - 10.4 mg/dL   Total Protein 6.4 6.1 - 8.1 g/dL   Albumin 4.2 3.6 - 5.1 g/dL   Globulin 2.2 1.9 - 3.7 g/dL (calc)   AG Ratio 1.9 1.0 - 2.5 (calc)   Total Bilirubin 0.5 0.2 - 1.2 mg/dL   Alkaline phosphatase (APISO) 31 (L) 37 - 153 U/L   AST 19 10 - 35 U/L   ALT 13 6 - 29 U/L  Lipid Profile   Collection Time: 05/12/23  8:39 AM  Result Value Ref Range   Cholesterol 151 <200 mg/dL   HDL 62 > OR = 50 mg/dL   Triglycerides 96 <284 mg/dL   LDL Cholesterol (Calc) 71 mg/dL (calc)   Total CHOL/HDL Ratio 2.4 <5.0 (calc)   Non-HDL Cholesterol (Calc) 89 <132 mg/dL (calc)  Hepatitis C Antibody   Collection Time: 05/12/23  8:39 AM  Result Value Ref Range   Hepatitis C Ab NON-REACTIVE NON-REACTIVE  HIV antibody (with reflex)   Collection Time: 05/12/23  8:39 AM  Result Value Ref Range   HIV 1&2 Ab, 4th Generation NON-REACTIVE NON-REACTIVE  TSH   Collection Time: 05/12/23  8:39 AM  Result Value Ref Range   TSH 1.46 mIU/L          Assessment & Plan:   Problem List Items Addressed This Visit   None Visit Diagnoses       Mass in rectum    -  Primary   Relevant Orders   Ambulatory referral to Gastroenterology        Assessment and Plan Assessment & Plan Perianal mass A non-painful, non-bleeding perianal mass has been present since  last Wednesday or Thursday. The mass is atypical for a hemorrhoid, being hard and not resembling a typical hemorrhoid. There is no associated constipation, anal bleeding, or history of anal intercourse.   - Refer to GI specialist for further evaluation of the perianal mass. - Ensure timely appointment to avoid prolonged  waiting period.          Follow up plan: Return if symptoms worsen or fail to improve.

## 2023-08-18 ENCOUNTER — Ambulatory Visit: Payer: Self-pay

## 2023-08-18 NOTE — Telephone Encounter (Signed)
 Called to check on patient. Left message that we reached out and will see her tomorrow

## 2023-08-18 NOTE — Telephone Encounter (Signed)
 FYI Only or Action Required?: Action required by provider  Patient was last seen in primary care on 07/23/2023 by [name], FNP. Called Nurse Triage reporting Insect Bite. Symptoms began yesterday. Interventions attempted: OTC medications: Benadryl, Rest, hydration, or home remedies, and Ice/heat application. Symptoms are: unchanged.  Triage Disposition: See HCP Within 4 Hours (Or PCP Triage)-no availability for UC at [facility]  Patient/caregiver understands and will follow disposition?: No, wishes to speak with PCP  Copied from CRM [id number]. Topic: Clinical - Red Word Triage >> Aug 18, 2023  1:28 PM [name] wrote: Red Word that prompted transfer to Nurse Triage: Patient states she is out of town and was stung by something, has some swelling in her upper L leg and is wanting a telehealth appointment. Nothing available till tomorrow, scheduled virtual visit due to patient being out of town. 10A Dr. Bud Care Reason for Disposition  [1] SEVERE bite pain AND [2] not improved after 2 hours of pain medicine  Answer Assessment - Initial Assessment Questions 1. TYPE of INSECT: "What type of insect was it?"      Unsure of what she got bit by 2. ONSET: "When did you get bitten?"      Yesterday around 7:00 PM 3. LOCATION: "Where is the insect bite located?"      Upper left leg 4. REDNESS: "Is the area red or pink?" If Yes, ask: "What size is area of redness?" (inches or cm). "When did the redness start?"     Redness with swelling 5. PAIN: "Is there any pain?" If Yes, ask: "How bad is it?"  (Scale 1-10; or mild, moderate, severe)     Mild 6. ITCHING: "Does it itch?" If Yes, ask: "How bad is the itch?"    - MILD: doesn't interfere with normal activities   - MODERATE-SEVERE: interferes with work, school, sleep, or other activities      Moderate to severe 7. SWELLING: "How big is the swelling?" (inches, cm, or compare to coins)     Bigger than palm of hand 8. OTHER SYMPTOMS: "Do you have  any other symptoms?"  (e.g., difficulty breathing, hives)     No  Patient is on [facility] and was stung by unknown insect last night. Patient states large area of swelling to her left upper thigh. Patient states swelling is bigger than her palm. Virtual appointment was scheduled for tomorrow prior to transfer to Nurse Triage. Discussed Urgent Care with patient who states there aren't any Urgent Care locations on the [place]. Asking to possibly be seen virtually today if possible.  Protocols used: Insect Bite-A-AH

## 2023-08-19 ENCOUNTER — Ambulatory Visit: Admitting: Internal Medicine

## 2023-11-23 ENCOUNTER — Other Ambulatory Visit: Payer: Self-pay | Admitting: Internal Medicine

## 2023-11-23 DIAGNOSIS — F4322 Adjustment disorder with anxiety: Secondary | ICD-10-CM

## 2023-11-24 NOTE — Telephone Encounter (Signed)
 Courtesy refill. Patient will need an office visit for additional refills.  Requested Prescriptions  Pending Prescriptions Disp Refills   sertraline  (ZOLOFT ) 100 MG tablet [Pharmacy Med Name: SERTRALINE  HCL 100 MG TAB] 30 tablet 0    Sig: TAKE ONE TABLET BY MOUTH EVERY DAY     Psychiatry:  Antidepressants - SSRI - sertraline  Failed - 11/24/2023  4:57 PM      Failed - Valid encounter within last 6 months    Recent Outpatient Visits           4 months ago Mass in rectum   [facility] Gareth Mliss FALCON, FNP   6 months ago Annual physical exam   [facility] Bernardo Fend, DO       Future Appointments             In 5 months Bernardo Fend, DO [facility] [place]            Passed - AST in normal range and within 360 days    AST  Date Value Ref Range Status  05/12/2023 19 10 - 35 U/L Final         Passed - ALT in normal range and within 360 days    ALT  Date Value Ref Range Status  05/12/2023 13 6 - 29 U/L Final         Passed - Completed PHQ-2 or PHQ-9 in the last 360 days

## 2023-12-19 NOTE — Progress Notes (Signed)
 [employer]   Facial Plastic and Reconstructive Surgery New Patient Consultation  Brittany Savage is seen in consultation at the request of Dr. Gretta for the evaluation of facial rejuvenation   History of Present Illness:  Brittany Savage is a 53 year old who presents for consideration of a face/neck lift. She has noticed that in time she has had significant descent of her midline neck which is very bothersome to her and she doesn't like to take pictures. She has been thinking of this for years. She has noted increase in her marionette lines and doesn't feel like she looks how she feels. She has been a patient of Dr. Gaylene for decades and has undergone filler and botox and is considering trichophytic brow lift and lower lid blepharoplasty with him. She is otherwise healthy and not on blood thinners. She has a history of breast cancer.   The patient has not been previously seen in our [facility] clinic.  Past Medical & Surgical History: Past Medical History[1]  Past Surgical History[2]  Past Family & Social History: Family History[3] Social History [4]  Medications: Medications Ordered Prior to Encounter[5]   Allergies: Allergies[6]  Review of Systems: Pertinent positive and negative systems described in HPI; the remainder of the 14 systems are negative.   Physical Exam: Vitals: BP 131/78   Pulse 54  General: Alert and oriented in no acute distress. Well-developed and well-nourished appearance.  Skin: Scattered solar lentigines and skin dyspigmentation  Psych: Normal mood and affect. Judgement and insight seem intact.  Eyes: Sciera anicteric b/l. EOMI. Minimal/moderate dermatochalasis. Prominent bilateral lower orbital pseudoherniation Bilateral brows peak laterally but are located at the orbital rim Bilateral prominent marionette lines and jowling No labiomental crease visible, prominent submental adiposity and platysmal laxity Excellent hyoid position,  with redraping of skin, evidence of excellent cervicomental angle. Ears: Pinna are normal in shape and position.  OC/OP: Tongue is fully mobile and protrudes midline.Resp: Normal WOB without stertor or stridor. Neuro: Cognition is intact, CN II-XII grossly intact. No focal deficits.  Musculoskeletal: Moves all extremities well without deformities.  Review of Data: Radiologic films, outside records, and other data was personally reviewed: I personally reviewed the referring physician documentation.  Impression: Brittany Savage presents today for consideration of a deep plane facelift. She has seen Dr. Cleotilde years ago but given his OR schedule she saw me today. She would be an excellent candidate. We discussed the differences in technique including skin only, SMAS plication, and the deep plane facelift. Discussed the technique of deep plane facelift and that it offers the most in depth approach to facial rejuvenation. We discussed planned incision lines, the need for overnight observation, drains for one night, and compressive dressings postoperatively. Discussed postoperative expectations in terms of swelling and bruising. Discussed risks including scar, numbness, infection, hematoma, bleeding, facial nerve paresis/injury, flap loss. She was provided a quote.    Also discussed the role of laser, specifically Halo laser plans in improving skin texture and pigmentation. We could do this before or after her surgery, pending timing. She was given a quote for this.   Will discuss with Dr. Gretta whether browlift with lower blepharoplasties would be beneficial to do in combination with a facelift as she defers to him and his judgment. Discussed this would be a longer time under anesthesia and more recovery time. She will look at her schedule at what dates may be ideal for her and get back to us  so we can look for OR time.   [name], MD Assistant Professor [facility]  Otolaryngology/Head &  Neck Surgery Facial Plastic and Reconstructive Surgery   CC: [name], *      [1] Past Medical History: Diagnosis Date  . Breast cancer    (CMS-HCC)   . Generalized anxiety disorder   [2] Past Surgical History: Procedure Laterality Date  . BREAST BIOPSY Left 02/2021   malignant  . BREAST LUMPECTOMY Left 03/2021  . DILATION AND CURETTAGE OF UTERUS    . PR BX/REMV,LYMPH NODE,DEEP AXILL Left 04/09/2021   Procedure: BX/EXC LYMPH NODE; OPEN, DEEP AXILRY NODE;  Surgeon: Cay Shawnee Shaggy, DO;  [facility]: [facility];  Service: Surgical Oncology Breast  . PR INTRAOPERATIVE SENTINEL LYMPH NODE ID W DYE INJECTION Left 04/09/2021   Procedure: INTRAOPERATIVE IDENTIFICATION SENTINEL LYMPH NODE(S) INCLUDE INJECTION NON-RADIOACTIVE DYE, WHEN PERFORMED;  Surgeon: Cay Shawnee Shaggy, DO;  Location: [facility];  Service: Surgical Oncology Breast  . PR MASTECTOMY, PARTIAL Left 04/09/2021   Procedure: MASTECTOMY, PARTIAL (EG, LUMPECTOMY, TYLECTOMY, QUADRANTECTOMY, SEGMENTECTOMY);  Surgeon: Kristalyn Kay Gallagher, DO;  Location: [facility] OR [facility];  Service: Surgical Oncology Breast  . PR SUSPENSION OF BREAST Right 04/09/2021   Procedure: MASTOPEXY;  Surgeon: Cay Shawnee Shaggy, DO;  Location: [facility] OR [facility];  Service: Surgical Oncology Breast  . RADIATION Left   . SINUS SURGERY    [3] Family History Adopted: Yes  Problem Relation Age of Onset  . Breast cancer Mother 62  . Heart disease Father   . Colon cancer Paternal Grandmother   . Breast cancer Maternal Aunt 69  [4] Social History Tobacco Use  . Smoking status: Never  . Smokeless tobacco: Never  Vaping Use  . Vaping status: Never Used  Substance and Sexual Activity  . Alcohol use: Yes    Alcohol/week: 14.0 standard drinks of alcohol    Types: 14 Glasses of wine per week  . Drug use: No  . Sexual activity: Yes    Partners: Male  Social History Narrative   The patient is married and lives in [place]. She has  three children.  She works in the Programmer, systems. She and her family have dogs and horses.   Social Drivers of Corporate investment banker Strain: Low Risk  (05/12/2023)   Received from [facility]   Overall Financial Resource Strain (CARDIA)   . Difficulty of Paying Living Expenses: Not hard at all  Food Insecurity: No Food Insecurity (05/12/2023)   Received from [facility]   Hunger Vital Sign   . Within the past 12 months, you worried that your food would run out before you got the money to buy more.: Never true   . Within the past 12 months, the food you bought just didn't last and you didn't have money to get more.: Never true  Transportation Needs: No Transportation Needs (05/12/2023)   Received from [facility] - Transportation   . Lack of Transportation (Medical): No   . Lack of Transportation (Non-Medical): No  Physical Activity: Sufficiently Active (05/12/2023)   Received from [facility]   Exercise Vital Sign   . On average, how many days per week do you engage in moderate to strenuous exercise (like a brisk walk)?: 4 days   . On average, how many minutes do you engage in exercise at this level?: 50 min  Stress: No Stress Concern Present (05/12/2023)   Received from [facility] of Occupational [facility] - Occupational Stress Questionnaire   .  Feeling of Stress : Only a little  Social Connections: Moderately Isolated (05/12/2023)   Received from [facility]   Social Connection and Isolation Panel   . In a typical week, how many times do you talk on the phone with family, friends, or neighbors?: More than three times a week   . How often do you get together with friends or relatives?: More than three times a week   . How often do you attend church or religious services?: Never   . Do you belong to any clubs or organizations such as church groups, unions, fraternal or athletic groups, or school groups?: No   . How often do you attend meetings of the  clubs or organizations you belong to?: Never   . Are you married, widowed, divorced, separated, never married, or living with a partner?: Married  Housing: Unknown (07/30/2023)   Received from [facility]   Housing Stability Vital Sign   . At any time in the past 12 months, were you homeless or living in a shelter (including now)?: No  [5] Current Outpatient Medications on File Prior to Visit  Medication Sig Dispense Refill  . ashwagandha extract 120 mg cap Take 500 mg by mouth in the morning.    . cyanocobalamin (VITAMIN B-12) 50 mcg tablet Take 1 tablet (50 mcg total) by mouth daily.    . fluticasone  (FLONASE ) 50 mcg/actuation nasal spray USE TWO PUFFS INTO EACH NOSTRIL DAILY    . multivitamin with minerals tablet Take 1 tablet by mouth daily.    . NON FORMULARY Thyroid  Supplement    . NON FORMULARY     . omega-3 fatty acids 1,000 mg cap Take by mouth.    . sertraline  (ZOLOFT ) 50 MG tablet Take 1 tablet (50 mg total) by mouth daily.    . sod chlor,sod bicarb/neti pot (NEILMED NASAFLO NASL) into each nostril.    . tamoxifen (NOLVADEX) 20 MG tablet Take 1 tablet (20 mg total) by mouth daily. 90 tablet 3   No current facility-administered medications on file prior to visit.  [6] Allergies Allergen Reactions  . Penicillins Rash  . Sulfa (Sulfonamide Antibiotics) Rash  . Sulfamethoxazole-Trimethoprim Rash

## 2023-12-29 ENCOUNTER — Other Ambulatory Visit: Payer: Self-pay | Admitting: Internal Medicine

## 2023-12-29 DIAGNOSIS — F4322 Adjustment disorder with anxiety: Secondary | ICD-10-CM

## 2023-12-30 ENCOUNTER — Telehealth: Payer: Self-pay

## 2023-12-30 ENCOUNTER — Telehealth: Payer: Self-pay | Admitting: Internal Medicine

## 2023-12-30 NOTE — Telephone Encounter (Signed)
 Duplicate encounter

## 2023-12-30 NOTE — Telephone Encounter (Signed)
 Copied from CRM [id number]. Topic: Clinical - Medication Question >> Dec 30, 2023  4:16 PM Brittany Savage wrote: Reason for CRM: Patient called in wanting to check status of medication refill for med, sertraline  (ZOLOFT ) 100 MG tablet, adv patient that I do show refill req was recvd from the pharmacy, however, I show it is still in process, adv that TAT is 3 bus days. Patient is stating that pharmacy told her that provider's office stated it cannot be refilled due to her needing to b seen, adv patient that I do not see this note from the provider, patient is req that this be filled ASAP as she is going out of town in 2 days and needs her meds, patient is req a call back in regards to this.

## 2023-12-30 NOTE — Telephone Encounter (Signed)
 Copied from CRM [id number]. Topic: Clinical - Prescription Issue >> Dec 30, 2023  4:11 PM Amy B wrote: Reason for CRM: Pharmacy called stating they sent a prescription for sertraline  (ZOLOFT ) 100 MG tablet yesterday.  Patient is going out of town and needs her meds.

## 2023-12-31 NOTE — Telephone Encounter (Signed)
 Pt needs an appt ASAP scheduled sooner for more refills per previous rx.

## 2023-12-31 NOTE — Telephone Encounter (Signed)
 Requested medication (s) are due for refill today: yes  Requested medication (s) are on the active medication list: yes  Last refill:  11/24/23  Future visit scheduled: yes  Notes to clinic:  unable to approve or refuse due to asterisks in protocol, routing for review

## 2023-12-31 NOTE — Telephone Encounter (Signed)
 Pt was advised to get a sooner appt for a refill on Zoloft . Pt states surely I  dont need an appt every 3-6 mths for Zoloft . I explained to pt that she will need to do an appt for refills. Pt continued to not schedule an appt. I explained to pt that Dr Bernardo was out of the office and that I would have the lead CMA contact her. Pt states she needs this medication before tomorrow Friday 01/01/24 because she is going out of town. Please notify pt.

## 2024-01-01 ENCOUNTER — Other Ambulatory Visit: Payer: Self-pay

## 2024-01-01 DIAGNOSIS — F4322 Adjustment disorder with anxiety: Secondary | ICD-10-CM

## 2024-01-01 MED ORDER — SERTRALINE HCL 100 MG PO TABS: 100.0000 mg | ORAL_TABLET | Freq: Every day | ORAL | 0 refills | Status: DC

## 2024-01-01 NOTE — Telephone Encounter (Signed)
 Appt sch'd with Dr Bernardo for November. But pt is completely out and is going out of town today for 10 days and is needing the refill

## 2024-01-01 NOTE — Telephone Encounter (Signed)
 Please send back to me so I can reach back out to her

## 2024-01-01 NOTE — Telephone Encounter (Signed)
 I went ahead and gave a courtesy refill, but states even though not documented or charge you all discussed in CPE and you told her you were good to see her in a  year.  Please advise, at end she stated she would do a virtual but would not come in office.

## 2024-01-04 NOTE — Telephone Encounter (Signed)
 Appt already scheduled for 11/4 with Dr Bernardo

## 2024-01-11 ENCOUNTER — Telehealth: Payer: Self-pay

## 2024-01-11 DIAGNOSIS — F4322 Adjustment disorder with anxiety: Secondary | ICD-10-CM

## 2024-01-11 MED ORDER — SERTRALINE HCL 100 MG PO TABS: 100.0000 mg | ORAL_TABLET | Freq: Every day | ORAL | 1 refills | Status: AC

## 2024-01-11 NOTE — Telephone Encounter (Signed)
 refills

## 2024-01-11 NOTE — Telephone Encounter (Signed)
 Lauraine already gone for the day.  Spoke with Dr. Bernardo for clarification on refill.  Pt states back at CPE in march she was asked how she was doing on meds and that she did not need to see her until a yr.  It was not documented in chart.  Per Dr. Bernardo, she probably did tell patient that and so at this time she will refill anxiety med till next appt for CPE in march and will do a split visit.

## 2024-01-11 NOTE — Telephone Encounter (Signed)
 Copied from CRM [id number]. Topic: Clinical - Prescription Issue >> Jan 11, 2024  8:13 AM Anairis L wrote: Reason for CRM: Patient is requesting a manager to call her regarding her zoloft  medication. She believed she was not suppose to have a appointment for refill. She has also canceled 01/12/2024 app. She is considering changing providers.

## 2024-01-11 NOTE — Telephone Encounter (Signed)
 Tried to reach out to patient left voice mail: Brittany Savage the manager was already gone for the day.  But told pt Dr. Bernardo will refill med and see her at her next appt in March for CPE and do a split visit.  I also told her I would reach out again tomorrow for confirmation.

## 2024-01-11 NOTE — Telephone Encounter (Signed)
 Returned patient's call stating I am clinical team lead but she refused to speak with me and was told she would receive a call today by 5:00 from office manager.

## 2024-01-12 ENCOUNTER — Ambulatory Visit: Admitting: Internal Medicine

## 2024-01-12 NOTE — Telephone Encounter (Signed)
 Please call patient, she did not want to talk with me

## 2024-02-24 NOTE — Telephone Encounter (Signed)
 LMTCB - However, after reviewing, it is best patient care to monitor medications such as Zoloft  on a six month bases max.  It may, at times, be appropriate to monitor every three months.

## 2024-03-15 ENCOUNTER — Ambulatory Visit: Payer: Self-pay

## 2024-03-15 ENCOUNTER — Other Ambulatory Visit: Payer: Self-pay

## 2024-03-15 ENCOUNTER — Telehealth (INDEPENDENT_AMBULATORY_CARE_PROVIDER_SITE_OTHER): Payer: Self-pay | Admitting: Internal Medicine

## 2024-03-15 DIAGNOSIS — F419 Anxiety disorder, unspecified: Secondary | ICD-10-CM | POA: Diagnosis not present

## 2024-03-15 MED ORDER — CLONAZEPAM 0.5 MG PO TABS: 0.5000 mg | ORAL_TABLET | Freq: Two times a day (BID) | ORAL | 0 refills | Status: AC | PRN
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
## 2024-03-15 NOTE — Telephone Encounter (Signed)
 Pt was only able to do virtual visit she has been put on dr prentice schedule for 03/16/24

## 2024-03-16 ENCOUNTER — Ambulatory Visit: Payer: Self-pay | Admitting: Internal Medicine

## 2024-05-12 ENCOUNTER — Ambulatory Visit: Payer: PRIVATE HEALTH INSURANCE | Admitting: Internal Medicine

## 2024-05-31 ENCOUNTER — Encounter: Admitting: Obstetrics
